# Patient Record
Sex: Male | Born: 1935 | Race: White | Hispanic: No | Marital: Married | Smoking: Never smoker
Health system: Southern US, Community
[De-identification: ages and names within clinical notes are randomized; demographics above are authoritative.]

## PROBLEM LIST (undated history)

## (undated) DIAGNOSIS — F419 Anxiety disorder, unspecified: Secondary | ICD-10-CM

## (undated) DIAGNOSIS — C801 Malignant (primary) neoplasm, unspecified: Secondary | ICD-10-CM

## (undated) DIAGNOSIS — Z8739 Personal history of other diseases of the musculoskeletal system and connective tissue: Secondary | ICD-10-CM

## (undated) DIAGNOSIS — J302 Other seasonal allergic rhinitis: Secondary | ICD-10-CM

## (undated) DIAGNOSIS — N400 Enlarged prostate without lower urinary tract symptoms: Secondary | ICD-10-CM

## (undated) DIAGNOSIS — N4289 Other specified disorders of prostate: Secondary | ICD-10-CM

## (undated) DIAGNOSIS — R531 Weakness: Secondary | ICD-10-CM

## (undated) DIAGNOSIS — R972 Elevated prostate specific antigen [PSA]: Secondary | ICD-10-CM

## (undated) DIAGNOSIS — M1990 Unspecified osteoarthritis, unspecified site: Secondary | ICD-10-CM

## (undated) DIAGNOSIS — H9190 Unspecified hearing loss, unspecified ear: Secondary | ICD-10-CM

## (undated) DIAGNOSIS — R310 Gross hematuria: Secondary | ICD-10-CM

## (undated) HISTORY — PX: CATARACT EXTRACTION W/ INTRAOCULAR LENS  IMPLANT, BILATERAL: SHX1307

## (undated) HISTORY — PX: PLANTAR'S WART EXCISION: SHX2240

## (undated) HISTORY — PX: TONSILLECTOMY: SUR1361

---

## 1998-04-28 ENCOUNTER — Ambulatory Visit (HOSPITAL_COMMUNITY): Admission: RE | Admit: 1998-04-28 | Discharge: 1998-04-28 | Payer: Self-pay | Admitting: Gastroenterology

## 2002-12-29 ENCOUNTER — Encounter (INDEPENDENT_AMBULATORY_CARE_PROVIDER_SITE_OTHER): Payer: Self-pay | Admitting: Specialist

## 2002-12-29 ENCOUNTER — Encounter: Payer: Self-pay | Admitting: Gastroenterology

## 2002-12-29 ENCOUNTER — Ambulatory Visit (HOSPITAL_COMMUNITY): Admission: RE | Admit: 2002-12-29 | Discharge: 2002-12-29 | Payer: Self-pay | Admitting: Gastroenterology

## 2006-03-15 ENCOUNTER — Encounter: Admission: RE | Admit: 2006-03-15 | Discharge: 2006-03-15 | Payer: Self-pay | Admitting: Internal Medicine

## 2007-01-28 ENCOUNTER — Ambulatory Visit (HOSPITAL_COMMUNITY): Admission: RE | Admit: 2007-01-28 | Discharge: 2007-01-28 | Payer: Self-pay

## 2007-01-29 ENCOUNTER — Encounter: Admission: RE | Admit: 2007-01-29 | Discharge: 2007-01-29 | Payer: Self-pay | Admitting: Internal Medicine

## 2007-09-18 ENCOUNTER — Encounter: Admission: RE | Admit: 2007-09-18 | Discharge: 2007-10-09 | Payer: Self-pay | Admitting: Gastroenterology

## 2008-06-08 ENCOUNTER — Ambulatory Visit: Payer: Self-pay | Admitting: Psychiatry

## 2008-08-08 ENCOUNTER — Encounter: Payer: Self-pay | Admitting: Emergency Medicine

## 2008-08-09 ENCOUNTER — Inpatient Hospital Stay (HOSPITAL_COMMUNITY): Admission: EM | Admit: 2008-08-09 | Discharge: 2008-08-12 | Payer: Self-pay | Admitting: Psychiatry

## 2008-08-16 ENCOUNTER — Other Ambulatory Visit (HOSPITAL_COMMUNITY): Admission: RE | Admit: 2008-08-16 | Discharge: 2008-09-27 | Payer: Self-pay | Admitting: Psychiatry

## 2008-08-19 ENCOUNTER — Ambulatory Visit: Payer: Self-pay | Admitting: Psychiatry

## 2008-08-24 ENCOUNTER — Emergency Department (HOSPITAL_COMMUNITY): Admission: EM | Admit: 2008-08-24 | Discharge: 2008-08-24 | Payer: Self-pay | Admitting: Emergency Medicine

## 2008-10-15 ENCOUNTER — Inpatient Hospital Stay (HOSPITAL_COMMUNITY): Admission: EM | Admit: 2008-10-15 | Discharge: 2008-10-18 | Payer: Self-pay | Admitting: Emergency Medicine

## 2008-10-15 ENCOUNTER — Ambulatory Visit: Payer: Self-pay | Admitting: Internal Medicine

## 2009-01-14 ENCOUNTER — Ambulatory Visit: Payer: Self-pay | Admitting: Surgery

## 2010-01-23 ENCOUNTER — Encounter: Admission: RE | Admit: 2010-01-23 | Discharge: 2010-01-23 | Payer: Self-pay | Admitting: Orthopedic Surgery

## 2010-01-25 ENCOUNTER — Ambulatory Visit (HOSPITAL_BASED_OUTPATIENT_CLINIC_OR_DEPARTMENT_OTHER): Admission: RE | Admit: 2010-01-25 | Discharge: 2010-01-25 | Payer: Self-pay | Admitting: Orthopedic Surgery

## 2010-01-25 HISTORY — PX: REPAIR SUBLUXING TENDON WITH GASTROC SLIDE: SHX5672

## 2010-11-20 LAB — COMPREHENSIVE METABOLIC PANEL
CO2: 26 mEq/L (ref 19–32)
Calcium: 9.4 mg/dL (ref 8.4–10.5)
Creatinine, Ser: 1.04 mg/dL (ref 0.4–1.5)
GFR calc non Af Amer: >60 mL/min (ref >60)
Glucose, Bld: 108 mg/dL — ABNORMAL HIGH (ref 70–99)

## 2010-11-20 LAB — POCT HEMOGLOBIN-HEMACUE: Hemoglobin: 14.3 g/dL (ref 13.0–17.0)

## 2010-12-18 LAB — BENZODIAZEPINE, QUANTITATIVE, URINE
Flurazepam GC/MS Conf: NEGATIVE
Nordiazepam GC/MS Conf: 250 ng/mL

## 2010-12-18 LAB — URINE DRUGS OF ABUSE SCREEN W ALC, ROUTINE (REF LAB)
Cocaine Metabolites: NEGATIVE
Methadone: NEGATIVE
Opiate Screen, Urine: NEGATIVE
Propoxyphene: NEGATIVE

## 2010-12-19 LAB — CBC
HCT: 30.2 % — ABNORMAL LOW (ref 39.0–52.0)
Hemoglobin: 10.5 g/dL — ABNORMAL LOW (ref 13.0–17.0)
MCHC: 34.8 g/dL (ref 30.0–36.0)
MCV: 92.4 fL (ref 78.0–100.0)
MCV: 92.8 fL (ref 78.0–100.0)
RBC: 3.25 MIL/uL — ABNORMAL LOW (ref 4.22–5.81)
RBC: 4 MIL/uL — ABNORMAL LOW (ref 4.22–5.81)
WBC: 8.7 10*3/uL (ref 4.0–10.5)

## 2010-12-19 LAB — CULTURE, BLOOD (ROUTINE X 2)
Culture: NO GROWTH
Culture: NO GROWTH

## 2010-12-19 LAB — POCT I-STAT, CHEM 8
Calcium, Ion: 1.22 mmol/L (ref 1.12–1.32)
Creatinine, Ser: 1.6 mg/dL — ABNORMAL HIGH (ref 0.4–1.5)
Glucose, Bld: 89 mg/dL (ref 70–99)
Hemoglobin: 12.9 g/dL — ABNORMAL LOW (ref 13.0–17.0)
Sodium: 141 mEq/L (ref 135–145)
TCO2: 24 mmol/L (ref 0–100)

## 2010-12-19 LAB — BASIC METABOLIC PANEL
BUN: 16 mg/dL (ref 6–23)
Creatinine, Ser: 0.98 mg/dL (ref 0.4–1.5)
GFR calc non Af Amer: >60 mL/min (ref >60)
Glucose, Bld: 79 mg/dL (ref 70–99)

## 2010-12-19 LAB — DIFFERENTIAL
Basophils Relative: 0 % (ref 0–1)
Eosinophils Absolute: 0.1 10*3/uL (ref 0.0–0.7)
Eosinophils Relative: 1 % (ref 0–5)
Monocytes Relative: 13 % — ABNORMAL HIGH (ref 3–12)
Neutrophils Relative %: 61 % (ref 43–77)

## 2010-12-19 LAB — CULTURE, ROUTINE-ABSCESS

## 2011-01-16 NOTE — Discharge Summary (Signed)
NAME:  Michael Estes, Michael Estes             ACCOUNT NO.:  [id number]   MEDICAL RECORD NO.:  [id number]          PATIENT TYPE:  INP   LOCATION:  5015                         FACILITY:  [facility]   PHYSICIAN:  Kela Millin, M.D.DATE OF BIRTH:  07-08-1932   DATE OF ADMISSION:  10/15/2008  DATE OF DISCHARGE:  10/18/2008                               DISCHARGE SUMMARY   DISCHARGE DIAGNOSES:  1. Right foot cellulitis with hemorrhagic bulla - On plantar surface      of right great toe, status post incision and drainage.  2. History of gout.  3. History of depression.  4. Prior history of alcohol abuse.   PROCEDURES AND STUDIES:  Incision and drainage of hemorrhagic bulla in  the ED - Cultures growing diphtheroids.   BRIEF HISTORY:  The patient is a pleasant 77 year old white male with  the above-listed medical problems who presented with right foot pain and  swelling.  He reported that he had worn some new shoes to a funeral and  the following day developed pain and swelling on the plantar aspect of  his right foot.  It progressed in the few days prior to admission to  where he was having chills and worsening pain and swelling in that right  foot.  He was seen in the ED and an x-ray of that foot was done which  showed a soft tissue lucency/swelling underlying the proximal phalanx of  the great toe compatible with the patient's known wound/blister and no  evidence of acute abnormality.  He was admitted for further evaluation  and management.   Please see the full admission history and physical dictated on October 15, 2008, by Dr. Amador Cunas for the details of the admission physical  exam, as well as the laboratory data.   HOSPITAL COURSE:  1. Right foot cellulitis with hemorrhagic bulla - Upon admission, the      hemorrhagic bulla was incised and drained in the ED and he was      empirically started on IV antibiotics.  The drainage was sent for      cultures and as stated above grew  diphtheroids.  He had an episode      of hypotension following admission and this resolved with IV      fluids.  Blood cultures were also sent for cultures and no growth      to date on the blood cultures.  He has remained afebrile and      hemodynamically stable and the mild leukocytosis that he had on      admission of 10.6 has resolved - His last white cell count prior to      discharge 8.7.  Wound care nurse was consulted and followed patient      during his hospital stay for local wound care and further debrided      the area on his great toe.  Home health R.N. is to follow patient      and assist with dressing changes.  He will be discharged on oral      antibiotics at this time and is to follow up  with his primary care      physician upon discharge.  2. History of gout - He was maintained on his colchicine during his      hospital stay.  3. History of depression - He was maintained on his Celexa during his      hospital stay.   DISCHARGE MEDICATIONS:  1. Ceftin 500 mg p.o. b.i.d.  2. Vicodin 1 p.o. every 6 hours p.r.n.  3. Patient to continue Celexa 20 mg p.o. daily.  4. Colchicine 0.6 mg p.o. daily.   FOLLOWUP CARE:  Dr. Kirby Funk in the next 1 to 2 weeks.   DISCHARGE CONDITION:  Improved/stable.      Kela Millin, M.D.  Electronically Signed     ACV/MEDQ  D:  10/18/2008  T:  10/18/2008  Job:  [id number]   cc:   Thora Lance, M.D.

## 2011-01-16 NOTE — Discharge Summary (Signed)
NAME:  Michael Estes, Michael Estes NO.:  [id number]   MEDICAL RECORD NO.:  [id number]          PATIENT TYPE:  IPS   LOCATION:  0305                          FACILITY[facility]  [facility]   PHYSICIAN:  Geoffery Lyons, M.D.      DATE OF BIRTH:  1932/05/14   DATE OF ADMISSION:  08/09/2008  DATE OF DISCHARGE:  08/12/2008                               DISCHARGE SUMMARY   CHIEF COMPLAINT AND PRESENT ILLNESS:  This was the first admission to  [facility] for this 77 year old male.  History of  alcohol use, drinking a lot.  He fell recently, sustained laceration,  left forehead.  Denying any suicidal or homicidal ideas, was wanting to  be detoxed.   PAST PSYCHIATRIC HISTORY:  First time at [facility].  No current  outpatient treatment.   ALCOHOL AND DRUG HISTORY:  Persistent use of alcohol.   MEDICAL HISTORY:  Status post laceration and abrasion, right knee.   MEDICATIONS:  1. Celexa 20 mg per day.  2. Colchicine 0.6 twice a day.  3. Diazepam 10 mg per day.   PHYSICAL EXAM:  Compatible with the above-described lesions.   LABORATORY WORKUP:  Sodium 135, potassium 3.9, glucose 96, TSH 3.307,  B12 of 471, folate 683.  RPR nonreactive.  EKG, normal EKG.   MENTAL STATUS EXAM:  Reveals a fully alert, cooperative male.  Good eye  contact.  Speech clear, very soft-spoken.  Mood anxious.  Affect broad.  Thought processes logical, coherent, and relevant.  Upset with himself  for having allowed the situation that is going on and underlying sadness  and depression, wanting to get his life back together.  No active  suicidal or homicidal ideas.  No hallucinations or delusions.  Cognition  well preserved.   ADMITTING DIAGNOSES:  AXIS I:  1. Alcohol dependence.  2. Major depressive disorder.  AXIS II:  No diagnosis.  AXIS III:  Status post fall with lacerations on the left forehead and  knee.  AXIS IV:  Moderate.  AXIS V:  Upon admission, 35.  Highest Global Assessment of  Functioning  in the last year 70.   COURSE IN THE HOSPITAL:  He was admitted.  We detoxed with Librium.  As  already stated, did admit that he fell down, was watching a football  game.  Admits he was drinking, drank too much.  He is retired, got into  drink a lot since retirement, mostly bourbon.  Does endorse decreased  energy, decreased motivation, decreased appetite.  In the past, had gone  to AA.  We went ahead and increased the Celexa to 40 mg per day.  The  detox went uneventfully.  The wife was active in treatment.  He was  willing to come to the [facility].  Did understand that he needed to  quit as alcohol is a depressant and is already causing problems for him.  He was willing to do whatever he needed to do to be able to stay  abstinent.  On August 12, 2008, he was in full contact with reality.  No active withdrawal.  No  active suicidal or homicidal ideas.  No  hallucinations or delusions.  Fully detoxed from alcohol.  The  antidepressants were adjusted.  He was willing to pursue outpatient  treatment through our [facility] program.   DISCHARGE DIAGNOSES:  AXIS I:  1. Alcohol dependence.  2. Major depressive disorder.  AXIS II:  No diagnosis.  AXIS III:  1. Laceration to face and knee status post fall.  2. Gout.  AXIS IV:  Moderate.  AXIS V:  Upon discharge 50 to 55.   Discharged on:  1. Celexa 40 mg per day.  2. Colchicine 0.6 twice a day.   Discharged to [facility], [facility].      Geoffery Lyons, M.D.  Electronically Signed     IL/MEDQ  D:  09/06/2008  T:  09/07/2008  Job:  [id number]

## 2011-01-16 NOTE — H&P (Signed)
NAME:  Michael Estes, KRING NO.:  [id number]   MEDICAL RECORD NO.:  [id number]          PATIENT TYPE:  INP   LOCATION:  5015                         FACILITY:  [facility]   PHYSICIAN:  Gordy Savers, MDDATE OF BIRTH:  [birth year]   DATE OF ADMISSION:  10/15/2008  DATE OF DISCHARGE:                              HISTORY & PHYSICAL   CHIEF COMPLAINT:  Pain and swelling, right foot.   HISTORY OF PRESENT ILLNESS:  The patient is a 77 year old gentleman who  was stable until approximately 6 days ago.  At that time, he wore some  new dress, shoes at a funeral and the following day developed some pain  and swelling involving the plantar aspect of his right foot.  This has  progressed over the past few days and over the past 2 days, he has noted  some occasional chills.  Today, he had worsening pain and soft tissue  swelling involving his right foot and was seen in the ED for evaluation.  ED evaluation included x-rays of the right foot.  This revealed soft  tissue swelling underlying the proximal phalanx of the great toe, but  there were no bony abnormalities, evidence of osteomyelitis, and no  subcutaneous air.  White count was essentially normal at 10.6.  The  patient was now admitted for further evaluation and treatment of his  right foot cellulitis.   PAST MEDICAL HISTORY:  The patient was hospitalized in December of last  year to the behavioral health center for alcoholic detoxification.  He  has a history of gout and a history of depression.  He has no known  allergies.  Medical regimen includes citalopram 20 mg daily, colchicine  0.6 mg daily.   SOCIAL HISTORY:  Married, nonsmoker, remains abstinent since his  hospital discharge.   FAMILY HISTORY:  Father died at age 43, complications of lung cancer and  COPD.  Mother died at age 20.  One brother deceased from CNS neoplasm.  Two sisters, one deceased of lung cancer.   REVIEW OF SYSTEMS:  Exam is, otherwise,  noncontributory except as  mentioned in history of present illness.  He denies any prior skin or  soft tissue infections.  There has been no exposure to MRSA and no  recent antibiotic use.   PHYSICAL EXAMINATION:  GENERAL:  Well-developed, well-nourished male in  no acute distress.  SKIN:  Warm and dry without rash.  NECK:  Normal pupil responses.  Conjunctiva clear.  ENT:  Normal.  The patient was edentulous.  NECK:  No bruits or adenopathy.  No neck vein distention.  CHEST:  Clear.  CARDIOVASCULAR:  Normal heart sounds.  No murmurs.  ABDOMEN:  Soft, nontender.  No organomegaly.  EXTERNAL GENITALIA:  Normal.  EXTREMITIES:  Full pedal pulses bilaterally.  The left foot was  unremarkable.  Examination of the right foot revealed a large  hemorrhagic blister involving the plantar aspect of the right first MTP  joint.  There was considerable soft tissue swelling and tenderness and  excessive warmth involving the toe.  This spread to the dorsal surface  of the toe and  distal foot.   IMPRESSION:  Cellulitis, right foot.   ADDITIONAL DIAGNOSES:  History of depression, history of gout, history  of alcoholism.   DISPOSITION:  The patient will be admitted to the hospital and placed on  parenteral antibiotic therapy.  The hemorrhagic bulla will be I and D'd  in the ED and sent for Gram stain and routine culture.  The followup CBC  will be checked in the morning.  Routine wound care with daily cleansing  and dressing will be instituted, as well as DVT prophylaxis.  The  patient will be treated with analgesics for pain control.      Gordy Savers, MD  Electronically Signed     PFK/MEDQ  D:  10/15/2008  T:  10/16/2008  Job:  [id number]

## 2011-06-07 LAB — ETHANOL: Alcohol, Ethyl (B): 299 mg/dL — ABNORMAL HIGH (ref 0–10)

## 2011-06-07 LAB — DIFFERENTIAL
Basophils Absolute: 0 10*3/uL (ref 0.0–0.1)
Eosinophils Absolute: 0.2 10*3/uL (ref 0.0–0.7)
Eosinophils Relative: 3 % (ref 0–5)
Lymphocytes Relative: 56 % — ABNORMAL HIGH (ref 12–46)
Lymphs Abs: 3.8 10*3/uL (ref 0.7–4.0)
Neutrophils Relative %: 29 % — ABNORMAL LOW (ref 43–77)

## 2011-06-07 LAB — CBC
HCT: 39.4 % (ref 39.0–52.0)
MCV: 97.6 fL (ref 78.0–100.0)
Platelets: 166 10*3/uL (ref 150–400)
RDW: 13.7 % (ref 11.5–15.5)
WBC: 6.8 10*3/uL (ref 4.0–10.5)

## 2011-06-07 LAB — BASIC METABOLIC PANEL
BUN: 8 mg/dL (ref 6–23)
Creatinine, Ser: 0.95 mg/dL (ref 0.4–1.5)
GFR calc non Af Amer: >60 mL/min (ref >60)
Glucose, Bld: 89 mg/dL (ref 70–99)

## 2011-06-07 LAB — RAPID URINE DRUG SCREEN, HOSP PERFORMED
Amphetamines: NOT DETECTED
Barbiturates: NOT DETECTED
Opiates: NOT DETECTED

## 2011-06-08 LAB — URINE DRUGS OF ABUSE SCREEN W ALC, ROUTINE (REF LAB)
Amphetamine Screen, Ur: NEGATIVE
Amphetamine Screen, Ur: NEGATIVE
Barbiturate Quant, Ur: NEGATIVE
Barbiturate Quant, Ur: NEGATIVE
Benzodiazepines.: POSITIVE — AB
Cocaine Metabolites: NEGATIVE
Creatinine,U: 182 mg/dL
Creatinine,U: 209 mg/dL
Ethyl Alcohol: <10 mg/dL (ref <10)
Marijuana Metabolite: NEGATIVE
Marijuana Metabolite: NEGATIVE
Methadone: NEGATIVE
Phencyclidine (PCP): NEGATIVE
Propoxyphene: NEGATIVE

## 2011-06-08 LAB — BENZODIAZEPINE, QUANTITATIVE, URINE
Alprazolam (GC/LC/MS), ur confirm: NEGATIVE
Alprazolam (GC/LC/MS), ur confirm: NEGATIVE
Flurazepam GC/MS Conf: NEGATIVE
Flurazepam GC/MS Conf: NEGATIVE
Nordiazepam GC/MS Conf: 710 ng/mL
Oxazepam GC/MS Conf: 1900 ng/mL
Oxazepam GC/MS Conf: 2500 ng/mL

## 2011-06-08 LAB — BASIC METABOLIC PANEL
CO2: 23 mEq/L (ref 19–32)
Chloride: 104 mEq/L (ref 96–112)
GFR calc Af Amer: >60 mL/min (ref >60)
Potassium: 3.9 mEq/L (ref 3.5–5.1)
Sodium: 135 mEq/L (ref 135–145)

## 2011-06-08 LAB — T4, FREE: Free T4: 0.97 ng/dL (ref 0.89–1.80)

## 2011-06-08 LAB — VITAMIN B12: Vitamin B-12: 471 pg/mL (ref 211–911)

## 2011-06-08 LAB — TSH: TSH: 3.307 u[IU]/mL (ref 0.350–4.500)

## 2011-06-08 LAB — FOLATE RBC: RBC Folate: 683 ng/mL — ABNORMAL HIGH (ref 180–600)

## 2011-06-08 LAB — RPR: RPR Ser Ql: NONREACTIVE

## 2013-09-15 ENCOUNTER — Emergency Department (HOSPITAL_COMMUNITY): Payer: Medicare Other

## 2013-09-15 ENCOUNTER — Encounter (HOSPITAL_COMMUNITY): Payer: Self-pay | Admitting: Emergency Medicine

## 2013-09-15 ENCOUNTER — Inpatient Hospital Stay (HOSPITAL_COMMUNITY): Payer: Medicare Other

## 2013-09-15 ENCOUNTER — Inpatient Hospital Stay (HOSPITAL_COMMUNITY)
Admission: EM | Admit: 2013-09-15 | Discharge: 2013-09-18 | DRG: 660 | Disposition: A | Discharge status: Home Health | Payer: Medicare Other | Attending: Internal Medicine | Admitting: Internal Medicine

## 2013-09-15 DIAGNOSIS — F411 Generalized anxiety disorder: Secondary | ICD-10-CM | POA: Diagnosis present

## 2013-09-15 DIAGNOSIS — Z808 Family history of malignant neoplasm of other organs or systems: Secondary | ICD-10-CM

## 2013-09-15 DIAGNOSIS — D62 Acute posthemorrhagic anemia: Secondary | ICD-10-CM | POA: Diagnosis not present

## 2013-09-15 DIAGNOSIS — R112 Nausea with vomiting, unspecified: Secondary | ICD-10-CM | POA: Diagnosis present

## 2013-09-15 DIAGNOSIS — C7919 Secondary malignant neoplasm of other urinary organs: Secondary | ICD-10-CM | POA: Diagnosis present

## 2013-09-15 DIAGNOSIS — N429 Disorder of prostate, unspecified: Secondary | ICD-10-CM

## 2013-09-15 DIAGNOSIS — N179 Acute kidney failure, unspecified: Secondary | ICD-10-CM | POA: Diagnosis present

## 2013-09-15 DIAGNOSIS — C7951 Secondary malignant neoplasm of bone: Secondary | ICD-10-CM | POA: Diagnosis present

## 2013-09-15 DIAGNOSIS — Z8042 Family history of malignant neoplasm of prostate: Secondary | ICD-10-CM

## 2013-09-15 DIAGNOSIS — D72829 Elevated white blood cell count, unspecified: Secondary | ICD-10-CM | POA: Diagnosis present

## 2013-09-15 DIAGNOSIS — R599 Enlarged lymph nodes, unspecified: Secondary | ICD-10-CM | POA: Diagnosis present

## 2013-09-15 DIAGNOSIS — M109 Gout, unspecified: Secondary | ICD-10-CM | POA: Diagnosis present

## 2013-09-15 DIAGNOSIS — N4289 Other specified disorders of prostate: Secondary | ICD-10-CM | POA: Diagnosis present

## 2013-09-15 DIAGNOSIS — R1110 Vomiting, unspecified: Secondary | ICD-10-CM

## 2013-09-15 DIAGNOSIS — N1330 Unspecified hydronephrosis: Secondary | ICD-10-CM | POA: Diagnosis present

## 2013-09-15 DIAGNOSIS — C7952 Secondary malignant neoplasm of bone marrow: Secondary | ICD-10-CM

## 2013-09-15 DIAGNOSIS — C61 Malignant neoplasm of prostate: Secondary | ICD-10-CM | POA: Diagnosis present

## 2013-09-15 DIAGNOSIS — N135 Crossing vessel and stricture of ureter without hydronephrosis: Secondary | ICD-10-CM | POA: Diagnosis present

## 2013-09-15 DIAGNOSIS — R109 Unspecified abdominal pain: Secondary | ICD-10-CM | POA: Diagnosis present

## 2013-09-15 HISTORY — DX: Benign prostatic hyperplasia without lower urinary tract symptoms: N40.0

## 2013-09-15 HISTORY — DX: Unspecified hearing loss, unspecified ear: H91.90

## 2013-09-15 HISTORY — DX: Other seasonal allergic rhinitis: J30.2

## 2013-09-15 HISTORY — DX: Unspecified osteoarthritis, unspecified site: M19.90

## 2013-09-15 HISTORY — DX: Anxiety disorder, unspecified: F41.9

## 2013-09-15 LAB — BASIC METABOLIC PANEL
BUN: 87 mg/dL — AB (ref 6–23)
CO2: 18 mEq/L — ABNORMAL LOW (ref 19–32)
Calcium: 8.9 mg/dL (ref 8.4–10.5)
Chloride: 100 mEq/L (ref 96–112)
Creatinine, Ser: 4.28 mg/dL — ABNORMAL HIGH (ref 0.50–1.35)
GFR, EST AFRICAN AMERICAN: 14 mL/min — AB (ref >90)
GFR, EST NON AFRICAN AMERICAN: 12 mL/min — AB (ref >90)
Glucose, Bld: 109 mg/dL — ABNORMAL HIGH (ref 70–99)
POTASSIUM: 3.9 meq/L (ref 3.7–5.3)
Sodium: 138 mEq/L (ref 137–147)

## 2013-09-15 LAB — URINALYSIS, ROUTINE W REFLEX MICROSCOPIC
BILIRUBIN URINE: NEGATIVE
Glucose, UA: NEGATIVE mg/dL
KETONES UR: NEGATIVE mg/dL
Leukocytes, UA: NEGATIVE
NITRITE: NEGATIVE
Protein, ur: NEGATIVE mg/dL
SPECIFIC GRAVITY, URINE: 1.015 (ref 1.005–1.030)
UROBILINOGEN UA: 0.2 mg/dL (ref 0.0–1.0)
pH: 5 (ref 5.0–8.0)

## 2013-09-15 LAB — HEPATIC FUNCTION PANEL
ALT: 18 U/L (ref 0–53)
AST: 38 U/L — AB (ref 0–37)
Albumin: 2.8 g/dL — ABNORMAL LOW (ref 3.5–5.2)
Alkaline Phosphatase: 156 U/L — ABNORMAL HIGH (ref 39–117)
Bilirubin, Direct: 0.4 mg/dL — ABNORMAL HIGH (ref 0.0–0.3)
Indirect Bilirubin: 0.8 mg/dL (ref 0.3–0.9)
Total Bilirubin: 1.2 mg/dL (ref 0.3–1.2)
Total Protein: 6 g/dL (ref 6.0–8.3)

## 2013-09-15 LAB — CBC
HEMATOCRIT: 31.9 % — AB (ref 39.0–52.0)
Hemoglobin: 11.2 g/dL — ABNORMAL LOW (ref 13.0–17.0)
MCH: 32.5 pg (ref 26.0–34.0)
MCHC: 35.1 g/dL (ref 30.0–36.0)
MCV: 92.5 fL (ref 78.0–100.0)
Platelets: 87 10*3/uL — ABNORMAL LOW (ref 150–400)
RBC: 3.45 MIL/uL — ABNORMAL LOW (ref 4.22–5.81)
RDW: 14.6 % (ref 11.5–15.5)
WBC: 15.7 10*3/uL — ABNORMAL HIGH (ref 4.0–10.5)

## 2013-09-15 LAB — URINE MICROSCOPIC-ADD ON

## 2013-09-15 LAB — CG4 I-STAT (LACTIC ACID): Lactic Acid, Venous: 1.33 mmol/L (ref 0.5–2.2)

## 2013-09-15 MED ORDER — SODIUM CHLORIDE 0.9 % IV SOLN
INTRAVENOUS | Status: DC
  Administered 2013-09-15 – 2013-09-18 (×4): via INTRAVENOUS

## 2013-09-15 MED ORDER — SODIUM CHLORIDE 0.9 % IV BOLUS (SEPSIS)
1000.0000 mL | Freq: Once | INTRAVENOUS | Status: AC
  Administered 2013-09-15: 1000 mL via INTRAVENOUS

## 2013-09-15 MED ORDER — PANTOPRAZOLE SODIUM 40 MG IV SOLR
40.0000 mg | Freq: Every day | INTRAVENOUS | Status: DC
  Administered 2013-09-15 – 2013-09-17 (×3): 40 mg via INTRAVENOUS
  Filled 2013-09-15 (×3): qty 40

## 2013-09-15 MED ORDER — ONDANSETRON HCL 4 MG PO TABS: 4.0000 mg | ORAL_TABLET | Freq: Four times a day (QID) | ORAL | Status: DC | PRN

## 2013-09-15 MED ORDER — CITALOPRAM HYDROBROMIDE 20 MG PO TABS
20.0000 mg | ORAL_TABLET | Freq: Every day | ORAL | Status: DC
  Administered 2013-09-16 – 2013-09-18 (×3): 20 mg via ORAL
  Filled 2013-09-15 (×3): qty 1

## 2013-09-15 MED ORDER — ACETAMINOPHEN 325 MG PO TABS: 650.0000 mg | ORAL_TABLET | Freq: Four times a day (QID) | ORAL | Status: DC | PRN

## 2013-09-15 MED ORDER — ONDANSETRON HCL 4 MG/2ML IJ SOLN: 4.0000 mg | Freq: Four times a day (QID) | INTRAMUSCULAR | Status: DC | PRN

## 2013-09-15 MED ORDER — ALLOPURINOL 100 MG PO TABS
100.0000 mg | ORAL_TABLET | Freq: Every day | ORAL | Status: DC
  Administered 2013-09-16 – 2013-09-18 (×3): 100 mg via ORAL
  Filled 2013-09-15 (×3): qty 1

## 2013-09-15 MED ORDER — ACETAMINOPHEN 650 MG RE SUPP: 650.0000 mg | Freq: Four times a day (QID) | RECTAL | Status: DC | PRN

## 2013-09-15 MED ORDER — LORATADINE 10 MG PO TABS
10.0000 mg | ORAL_TABLET | Freq: Every day | ORAL | Status: DC
Start: 2013-09-16 — End: 2013-09-18
  Administered 2013-09-16 – 2013-09-18 (×3): 10 mg via ORAL
  Filled 2013-09-15 (×3): qty 1

## 2013-09-15 MED ORDER — MORPHINE SULFATE 2 MG/ML IJ SOLN
0.5000 mg | INTRAMUSCULAR | Status: DC | PRN
  Administered 2013-09-16: 1 mg via INTRAVENOUS
  Filled 2013-09-15: qty 1

## 2013-09-15 MED ORDER — AZITHROMYCIN 250 MG PO TABS
250.0000 mg | ORAL_TABLET | Freq: Every day | ORAL | Status: DC
  Administered 2013-09-16: 250 mg via ORAL
  Filled 2013-09-15 (×2): qty 1

## 2013-09-15 MED ORDER — ENOXAPARIN SODIUM 30 MG/0.3ML ~~LOC~~ SOLN
30.0000 mg | SUBCUTANEOUS | Status: DC
  Administered 2013-09-15 – 2013-09-16 (×2): 30 mg via SUBCUTANEOUS
  Filled 2013-09-15 (×3): qty 0.3

## 2013-09-15 NOTE — Progress Notes (Signed)
Foley has been d/c'ed-patient refused

## 2013-09-15 NOTE — Progress Notes (Signed)
Michael Estes [id number] Admission Data: 09/15/2013 7:53 PM Attending Provider: Sheila Oats, MD  PCP:No primary provider on file. Consults/ Treatment Team:    Michael Estes is a 80 y.o. male patient admitted from ED awake, alert  & orientated  X 3,  Full Code, VSS - Blood pressure 148/74, pulse 79, temperature 98 F (36.7 C), temperature source Oral, resp. rate 18, SpO2 99.00%.   IV site WDL: R ac placed the 09/15/13  with a transparent dsg that's clean dry and intact.  Allergies:  No Known Allergies   Past Medical History  Diagnosis Date  . Gout   . Seasonal allergies   . Anxiety     Pt orientation to unit, room and routine.  Admission INP armband ID verified with patient/family, and in place. SR up x 2, fall risk assessment complete with Patient and family verbalizing understanding of risks associated with falls. Pt verbalizes an understanding of how to use the call bell and to call for help before getting out of bed.  Skin, clean-dry- intact without evidence of bruising, or skin tears.   No evidence of skin break down noted on exam.     Will cont to monitor and assist as needed.  Dayle Points, RN 09/15/2013 7:53 PM

## 2013-09-15 NOTE — ED Notes (Signed)
Lactic acid results given to [employer]

## 2013-09-15 NOTE — ED Provider Notes (Signed)
CSN: [id number]     Arrival date & time 09/15/13  0848 History   First MD Initiated Contact with Patient 09/15/13 [phone]     Chief Complaint  Patient presents with  . Generalized Body Aches  . Emesis  . Flank Pain   (Consider location/radiation/quality/duration/timing/severity/associated sxs/prior Treatment) HPI Comments: Sent in by PCP for lab work shows concerns for renal failure. Recently sick with flu like symptoms for past 4 days.  Patient is a 80 y.o. male presenting with vomiting and flank pain. The history is provided by the patient.  Emesis Severity:  Moderate Duration:  4 days Timing:  Constant Quality:  Stomach contents Progression:  Unchanged Chronicity:  New Recent urination:  Normal Relieved by:  Nothing Worsened by:  Nothing tried Associated symptoms: chills   Associated symptoms: no abdominal pain, no cough, no fever, no myalgias, no sore throat and no URI   Flank Pain Pertinent negatives include no abdominal pain.    History reviewed. No pertinent past medical history. History reviewed. No pertinent past surgical history. History reviewed. No pertinent family history. History  Substance Use Topics  . Smoking status: Never Smoker   . Smokeless tobacco: Never Used  . Alcohol Use: Yes    Review of Systems  Constitutional: Positive for chills.  HENT: Negative for sore throat.   Gastrointestinal: Positive for vomiting. Negative for abdominal pain.  Genitourinary: Positive for flank pain.  Musculoskeletal: Negative for myalgias.  All other systems reviewed and are negative.    Allergies  Review of patient's allergies indicates no known allergies.  Home Medications  No current outpatient prescriptions on file. BP 104/66  Pulse 101  Temp(Src) 97.8 F (36.6 C) (Oral)  Resp 18  SpO2 100% Physical Exam  Nursing note and vitals reviewed. Constitutional: He is oriented to person, place, and time. He appears well-developed and well-nourished. No  distress.  HENT:  Head: Normocephalic and atraumatic.  Mouth/Throat: No oropharyngeal exudate.  Eyes: EOM are normal. Pupils are equal, round, and reactive to light.  Neck: Normal range of motion. Neck supple.  Cardiovascular: Normal rate and regular rhythm.  Exam reveals no friction rub.   No murmur heard. Pulmonary/Chest: Effort normal and breath sounds normal. No respiratory distress. He has no wheezes. He has no rales.  Abdominal: He exhibits no distension. There is tenderness (mild, left sided). There is no rebound.  Musculoskeletal: Normal range of motion. He exhibits no edema.  Neurological: He is alert and oriented to person, place, and time.  Skin: He is not diaphoretic.    ED Course  Procedures (including critical care time) Labs Review Labs Reviewed  CBC  BASIC METABOLIC PANEL  URINALYSIS, ROUTINE W REFLEX MICROSCOPIC   Imaging Review Ct Abdomen Pelvis Wo Contrast  09/15/2013   CLINICAL DATA:  General body aches and. Emesis. Left-sided flank pain.  EXAM: CT ABDOMEN AND PELVIS WITHOUT CONTRAST  TECHNIQUE: Multidetector CT imaging of the abdomen and pelvis was performed following the standard protocol without intravenous contrast.  COMPARISON:  01/28/2007 CT abdomen pelvis.  FINDINGS: Basilar scarring/ subsegmental atelectasis.  Markedly enlarged prostate gland with asymmetric extension to the right. This may be invading the right bladder wall at the level of the right ureterovesical junction contributing to prominent right-sided hydroureteronephrosis. This highly suspicious for prostate carcinoma. Pelvic adenopathy greatest right operator region measuring 3.7 x 2 cm. Right retrocrural slightly prominent size lymph node. Para-aortic and interaortic caval adenopathy. Multiple sclerotic foci involving the ileum bilaterally, sacrum, L3, L2 and T12 suspicious for  sclerotic metastatic disease.  Tiny nonobstructing renal calculi.  Taking into account limitation by non contrast imaging, no  worrisome of hepatic, splenic, pancreatic, renal or adrenal lesion. No calcified gallstones.  Scattered colonic diverticula without extra luminal bowel inflammatory process, free fluid or free air.  Ankylosis L2 through S1.  Degenerative changes above this region.  Atherosclerotic type changes aorta with marked ectasia without focal aneurysm. Atherosclerotic type changes with ectasia common iliac arteries. Atherosclerotic type changes aortic branch vessels with ectatic tortuous splenic artery.  IMPRESSION: Findings highly suspicious for prostate cancer with with invasion of the right bladder wall causing marked right-sided hydronephrosis. Metastatic disease suspected as indicated by adenopathy and the osseous sclerotic foci as noted above.  Atherosclerotic type changes aorta and common iliac arteries.  These results were called by telephone at the time of interpretation on 09/15/2013 at 11:48 AM to Dr. Murlean Caller , who verbally acknowledged these results.   Electronically Signed   By: Chauncey Cruel M.D.   On: 09/15/2013 11:46   Dg Chest 2 View  09/15/2013   CLINICAL DATA:  Elevated WBC, weakness, confusion  EXAM: CHEST  2 VIEW  COMPARISON:  01/23/2010  FINDINGS: Cardiomediastinal silhouette is stable. No acute infiltrate or pleural effusion. No pulmonary edema. Mild degenerative changes thoracic spine.  IMPRESSION: No active cardiopulmonary disease.   Electronically Signed   By: Lahoma Crocker M.D.   On: 09/15/2013 11:18   US Renal  09/15/2013   CLINICAL DATA:  Elevated creatinine  EXAM: RENAL/URINARY TRACT ULTRASOUND COMPLETE  COMPARISON:  CT from earlier in the same day.  FINDINGS: Right Kidney:  Length: 11.2 cm in length. Hydronephrosis with prominent ureter is noted similar to that seen on recent CT.  Left Kidney:  Length: 12.2 cm. Echogenicity within normal limits. No mass or hydronephrosis visualized.  Bladder:  Bladder is well distended. Significant enlargement of the prostate (6.3 x 4.1 x 5.1 cm) is  noted similar to that seen on recent CT examination. Bilateral ureteral jets are noted however.  IMPRESSION: Right-sided hydronephrosis and hydroureter consistent with that seen on recent CT examination.  Enlarged prostate similar to that seen on recent CT examination highly suggestive of prostate carcinoma with invasion into the bladder.   Electronically Signed   By: Inez Catalina M.D.   On: 09/15/2013 19:21    EKG Interpretation    Date/Time:  Tuesday September 15 2013 09:00:27 EST Ventricular Rate:  99 PR Interval:  155 QRS Duration: 110 QT Interval:  362 QTC Calculation: 464 R Axis:     Text Interpretation:  Sinus rhythm No significant change Confirmed by Mingo Amber  MD, Brumley ([id number]) on 09/15/2013 9:17:03 AM            MDM   1. Acute renal failure   2. Vomiting   3. Hydronephrosis   4. Leukocytosis, unspecified   5. Nausea and vomiting   6. Prostate mass    80 year old male presents with concerns for dehydration. He was sent in by his primary physician he checks labs yesterday and is concerned about renal failure. Patient has been having vomiting for the past 4 days. He was concerned that the fluid disease had some chills and mild upper respiratory symptoms. Here he has stable vitals. His belly has mild tenderness in the left side without rebound or guarding. He does not appear dehydrated with moist mucous membranes, normal skin turgor, brisk cap refill. We'll give fluids and check labs again. Labs show ARF. CT of abdomen, requested by Internal  Medicine, shows signs concerning for metastatic prostate cancer. I discussed this with the patient. Admitted to medicine.   I have reviewed all labs and imaging and considered them in my medical decision making.    Osvaldo Shipper, MD 09/16/13 [phone]

## 2013-09-15 NOTE — Progress Notes (Signed)
Patient was suppose to receive a foley this afternoon. Patient has denied this procedure and stated to the RN that he is urinating just fine. NP Baltazar Najjar has been paged. RN will continue to monitor

## 2013-09-15 NOTE — Progress Notes (Addendum)
[facility] History and Physical  Michael Estes KWI:[id number] DOB: 07-29-1932 DOA: 09/15/2013  Referring physician: EDP PCP: No primary provider on file.   Chief Complaint: Asst. nausea vomiting and abdominal pain  HPI: Michael Estes is a 80 y.o. male with history of gout and anxiety or presents with complaints of nausea or vomiting x4 days. He states that he had also been having some URI symptoms and he was evaluated at his PCPs office for the flu but was told that he did not have it and was been treated with a Zithromax. He reports that each time he attempts to eat, he vomits-non bloody. He also reports that he has had abdominal pain was on the eft side and radiates down to his right left groin. He denies dysuria, diarrhea, melena and hematochezia. In the ED urinalysis was not impressive for UTI, chest x-ray was negative for acute findings, WBC count was 15 lactic acid within normal limits. CT scan of his abdomen and pelvis was done and findings highly suspicious for prostate cancer with invasion of the right bladder wall causing marked right-sided hydronephrosis-metastatic disease suspected as indicated adenopathy and question of sclerotic foci at T12, L2, L 3 and the ileum bilaterally and sacrum. He is admitted for further evaluation and management    Review of Systems The patient denies , weight loss,, vision loss, decreased hearing, hoarseness, chest pain, syncope, dyspnea on exertion, peripheral edema, balance deficits, hemoptysis, melena, hematochezia, severe indigestion/heartburn, hematuria, incontinence.  Past Medical History  Diagnosis Date  . Gout   . Seasonal allergies   . Anxiety    History reviewed. No pertinent past surgical history. Social History:  reports that he has never smoked. He has never used smokeless tobacco. He reports that he drinks alcohol. He reports that he does not use illicit drugs.  No Known Allergies  Family history: His father has had  prostate cancer, sister breast cancer and diabetes, another sister had pancreatic cancer and one of his brothers had brain cancer  Prior to Admission medications   Medication Sig Start Date End Date Taking? Authorizing Provider  allopurinol (ZYLOPRIM) 300 MG tablet Take 300 mg by mouth daily.   Yes Historical Provider, MD  azithromycin (ZITHROMAX) 250 MG tablet Take 250-500 mg by mouth daily. Taking 500 mg for one day, then 250mg  for 4 days.  Started on 09-14-13.   Yes Historical Provider, MD  cetirizine (ZYRTEC) 10 MG tablet Take 10 mg by mouth daily.   Yes Historical Provider, MD  Cholecalciferol (VITAMIN D PO) Take 1 tablet by mouth daily.   Yes Historical Provider, MD  citalopram (CELEXA) 20 MG tablet Take 20 mg by mouth daily.   Yes Historical Provider, MD  promethazine (PHENERGAN) 25 MG tablet Take 25-50 mg by mouth every 6 (six) hours as needed for nausea or vomiting.   Yes Historical Provider, MD   Physical Exam: Filed Vitals:   09/15/13 1715  BP: 148/74  Pulse: 79  Temp: 98 F (36.7 C)  Resp: 18    BP 148/74  Pulse 79  Temp(Src) 98 F (36.7 C) (Oral)  Resp 18  SpO2 99% Constitutional: Vital signs reviewed.  Patient is a well-developed and well-nourished  in no acute distress and cooperative with exam. Alert and oriented x3.  Head: Normocephalic and atraumatic Mouth: no erythema or exudates, dry MM Eyes: PERRL, EOMI, conjunctivae normal, No scleral icterus.  Neck: Supple, Trachea midline normal ROM, No JVD, mass, thyromegaly, or carotid bruit present.  Cardiovascular: RRR,  S1 normal, S2 normal, no MRG, pulses symmetric and intact bilaterally Pulmonary/Chest: normal respiratory effort, CTAB, no wheezes, rales, or rhonchi Abdominal: Soft. Mild right sided tenderness, greater left sided tenderness, no rebound, non-distended, bowel sounds are normal, no masses palpable, organomegaly, or guarding present.  Extremities: No cyanosis and no edema  Neurological: A&O x3, Strength is  normal and symmetric bilaterally, cranial nerve II-XII are grossly intact, no focal motor deficit, sensory intact to light touch bilaterally.  Skin: Warm, dry and intact. No rash, cyanosis, or clubbing.  Psychiatric: Normal mood and affect. speech and behavior is normal. J              Labs on Admission:  Basic Metabolic Panel:  Recent Labs Lab 09/15/13 0930  NA 138  K 3.9  CL 100  CO2 18*  GLUCOSE 109*  BUN 87*  CREATININE 4.28*  CALCIUM 8.9   Liver Function Tests: No results found for this basename: AST, ALT, ALKPHOS, BILITOT, PROT, ALBUMIN,  in the last 168 hours No results found for this basename: LIPASE, AMYLASE,  in the last 168 hours No results found for this basename: AMMONIA,  in the last 168 hours CBC:  Recent Labs Lab 09/15/13 0930  WBC 15.7*  HGB 11.2*  HCT 31.9*  MCV 92.5  PLT 87*   Cardiac Enzymes: No results found for this basename: CKTOTAL, CKMB, CKMBINDEX, TROPONINI,  in the last 168 hours  BNP (last 3 results) No results found for this basename: PROBNP,  in the last 8760 hours CBG: No results found for this basename: GLUCAP,  in the last 168 hours  Radiological Exams on Admission: Ct Abdomen Pelvis Wo Contrast  09/15/2013   CLINICAL DATA:  General body aches and. Emesis. Left-sided flank pain.  EXAM: CT ABDOMEN AND PELVIS WITHOUT CONTRAST  TECHNIQUE: Multidetector CT imaging of the abdomen and pelvis was performed following the standard protocol without intravenous contrast.  COMPARISON:  01/28/2007 CT abdomen pelvis.  FINDINGS: Basilar scarring/ subsegmental atelectasis.  Markedly enlarged prostate gland with asymmetric extension to the right. This may be invading the right bladder wall at the level of the right ureterovesical junction contributing to prominent right-sided hydroureteronephrosis. This highly suspicious for prostate carcinoma. Pelvic adenopathy greatest right operator region measuring 3.7 x 2 cm. Right retrocrural slightly prominent  size lymph node. Para-aortic and interaortic caval adenopathy. Multiple sclerotic foci involving the ileum bilaterally, sacrum, L3, L2 and T12 suspicious for sclerotic metastatic disease.  Tiny nonobstructing renal calculi.  Taking into account limitation by non contrast imaging, no worrisome of hepatic, splenic, pancreatic, renal or adrenal lesion. No calcified gallstones.  Scattered colonic diverticula without extra luminal bowel inflammatory process, free fluid or free air.  Ankylosis L2 through S1.  Degenerative changes above this region.  Atherosclerotic type changes aorta with marked ectasia without focal aneurysm. Atherosclerotic type changes with ectasia common iliac arteries. Atherosclerotic type changes aortic branch vessels with ectatic tortuous splenic artery.  IMPRESSION: Findings highly suspicious for prostate cancer with with invasion of the right bladder wall causing marked right-sided hydronephrosis. Metastatic disease suspected as indicated by adenopathy and the osseous sclerotic foci as noted above.  Atherosclerotic type changes aorta and common iliac arteries.  These results were called by telephone at the time of interpretation on 09/15/2013 at 11:48 AM to Dr. Murlean Caller , who verbally acknowledged these results.   Electronically Signed   By: Chauncey Cruel M.D.   On: 09/15/2013 11:46   Dg Chest 2 View  09/15/2013  CLINICAL DATA:  Elevated WBC, weakness, confusion  EXAM: CHEST  2 VIEW  COMPARISON:  01/23/2010  FINDINGS: Cardiomediastinal silhouette is stable. No acute infiltrate or pleural effusion. No pulmonary edema. Mild degenerative changes thoracic spine.  IMPRESSION: No active cardiopulmonary disease.   Electronically Signed   By: Lahoma Crocker M.D.   On: 09/15/2013 11:18      Assessment/Plan Active Problems:   Acute renal failure -Likely secondary to obstructive uropathy and component of volume depletion -place foley Catheter, I have consulted urology>> Dr. Aline Brochure to  see [facility] with IV fluids -Follow and recheck   R.Hydronephrosis -As discussed above secondary to probable prostate mass/ca -Please Foley catheter, urology to see as above for possible nephrostomy Nausea or vomiting -Likely due to uremia/elevated BUN  -Follow with hydration Leukocytosis, unspecified -Urinalysis and chest x-ray negative -Possibly reactive, follow and recheck he is hemodynamically stable and afebrile   Prostate mass/probable prostate cancer with lymphadenopathy and sclerotic lesions -Urology consulted as above -Check PSA follow H/o Gout -Continue allopurinol, renal dose History of anxiety/depression -Continue Celexa Recent URI -Continue Z-Pak      Code Status: full Family Communication: Wife and sister at bedside Disposition Plan: Admit to [facility]  Time spent: >33mins  [facility] Pager [phone]

## 2013-09-15 NOTE — Consult Note (Signed)
I was asked to see [name] by Dr. Gaetano Net, MD for evaluation and management of prostate mass and right hydronephrosis.  ID: 39M admitted with four days of vomiting, weakness, and left sided groin/pelvic pain.  HPI: This is a 80 year old male who presented to the emergency department with 4 days of progressive weakness, nausea/vomiting and worsening left-sided groin and abdominal pain. In the course of the patient's workup for these symptoms he was found to have an elevated creatinine and underwent a CT of the abdomen and pelvis. The CAT scan revealed an irregularly shaped prostate, which is lobulated with scattered calcifications, and appears to be growing into the bladder wall and invading the right ureter. In addition there is a large pelvic lymph node measuring approximately 3 cm as well as sclerotic bone lesions in the thoracic and lumbar spine as well as iliac crests. which is concerning for metastatic disease.    The patient relates a history of progressive lower urinary tract symptoms including urinary frequency and urge/urge incontinence that has progressed over the last 6 weeks. He denies a history of hematuria. He denies any back or bone pain. He denies any lower showing swelling. The patient has a family history of prostate cancer, his dad had aggressive prostate cancer, although he also had lung cancer simultaneously and appears to have died from his lung cancer. The patient tells me that he has had routine prostate screening up until about 3 years ago. The patient denies any changes in his bowel habits. Patient denies any neurological changes or no neurological deficits.   Review of systems: The patient has limited mobility secondary to a right foot surgery several years ago. The patient denied any fevers, weight loss or night sweats. The remaining 12 point conference of review of systems is negative.  Patient Active Problem List   Diagnosis Date Noted  . Acute renal failure  09/15/2013  . Leukocytosis, unspecified 09/15/2013  . Nausea and vomiting 09/15/2013  . Abdominal pain, unspecified site 09/15/2013  . ARF (acute renal failure) 09/15/2013  . Prostate mass 09/15/2013  . Hydronephrosis 09/15/2013   No current facility-administered medications on file prior to encounter.   No current outpatient prescriptions on file prior to encounter.   Past Surgical History  Procedure Laterality Date  . Plantar's wart excision Bilateral   . Tonsillectomy      "when I was a kid" (09/15/2013)  . Cataract extraction w/ intraocular lens  implant, bilateral Bilateral    History   Social History  . Marital Status: Married    Spouse Name: N/A    Number of Children: N/A  . Years of Education: N/A   Occupational History  . Not on file.   Social History Main Topics  . Smoking status: Never Smoker   . Smokeless tobacco: Never Used  . Alcohol Use: 12.6 oz/week    21 Shots of liquor per week     Comment: 09/15/2013 "3-4 mixed drinks/day; not so much since the end of 2014"  . Drug Use: No  . Sexual Activity: Yes   Other Topics Concern  . Not on file   Social History Narrative  . No narrative on file   FH: father with prostate cancer  PE: Filed Vitals:   09/15/13 1330 09/15/13 1400 09/15/13 1500 09/15/13 1715  BP: 103/81 124/68 138/70 148/74  Pulse:  78 78 79  Temp:    98 F (36.7 C)  TempSrc:    Oral  Resp:   18 18  SpO2:  100% 100% 99%   NAD, alert and oriented x3 Normo-cephalic atraumatic head, good dentition No cervical or supraclavicular lymphadenopathy, there is no appreciable inguinal lymphadenopathy either Normal respiratory effort, no audible wheezing Heart rate is regular, no significant peripheral edema Abdomen is soft nontender nondistended, there is no CVA tenderness, the kidneys are nonpalpable, there is no suprapubic tenderness. The patient does have point tenderness in the left groin region inferior to the ilioinguinal ligament. Normal  male phenotypic genitalia, urethral meatus and a orthotopic, no scrotal or penile lesions, testicles are of normal size The patient's prostate is grossly abnormal. It is not feel terribly enlarged, but it is very hard and nodular diffusely, there appears to be extracapsular extension on the right lateral aspect. Lower extremities are symmetric without significant edema   Recent Labs  09/15/13 0930  WBC 15.7*  HGB 11.2*  HCT 31.9*    Recent Labs  09/15/13 0930  NA 138  K 3.9  CL 100  CO2 18*  GLUCOSE 109*  BUN 87*  CREATININE 4.28*  CALCIUM 8.9   No results found for this basename: LABPT, INR,  in the last 72 hours No results found for this basename: LABURIN,  in the last 72 hours Results for orders placed during the hospital encounter of 10/15/08  CULTURE, ROUTINE-ABSCESS     Status: None   Collection Time    10/15/08  9:53 PM      Result Value Range Status   Specimen Description ABSCESS FOOT RIGHT   Final   Special Requests NONE   Final   Gram Stain     Final   Value: RARE WBC PRESENT, PREDOMINANTLY PMN     NO SQUAMOUS EPITHELIAL CELLS SEEN     MODERATE GRAM NEGATIVE RODS     MODERATE GRAM POSITIVE COCCI     IN PAIRS IN CLUSTERS   Culture     Final   Value: MODERATE DIPHTHEROIDS(CORYNEBACTERIUM SPECIES)     Note: Standardized susceptibility testing for this organism is not available.     ABUNDANT PREVOTELLA BIVIA     Note: BETA LACTAMASE POSITIVE   Report Status 10/20/2008 FINAL   Final  CULTURE, BLOOD (ROUTINE X 2)     Status: None   Collection Time    10/16/08  4:10 PM      Result Value Range Status   Specimen Description BLOOD LEFT HAND   Final   Special Requests BOTTLES DRAWN AEROBIC AND ANAEROBIC 10CC   Final   Culture NO GROWTH 5 DAYS   Final   Report Status 10/22/2008 FINAL   Final  CULTURE, BLOOD (ROUTINE X 2)     Status: None   Collection Time    10/16/08  4:15 PM      Result Value Range Status   Specimen Description BLOOD LEFT HAND   Final    Special Requests BOTTLES DRAWN AEROBIC ONLY 2CC   Final   Culture NO GROWTH 5 DAYS   Final   Report Status 10/22/2008 FINAL   Final   PSA pending Alkaline phosphatase pending  Imaging: I have personally reviewed the patient's noncontrast abdomen and pelvis CT scan.  Findings: Markedly enlarged prostate gland with asymmetric extension to the  right. This may be invading the right bladder wall at the level of  the right ureterovesical junction contributing to prominent  right-sided hydroureteronephrosis. This highly suspicious for  prostate carcinoma. Pelvic adenopathy greatest right operator region  measuring 3.7 x 2 cm. Right retrocrural slightly prominent  size  lymph node. Para-aortic and interaortic caval adenopathy. Multiple  sclerotic foci involving the ileum bilaterally, sacrum, L3, L2 and  T12 suspicious for sclerotic metastatic disease.   Impression: Overall the patient's physical exam findings, CT scan and symptoms are consistent with aggressive metastatic prostate cancer. The patient's PSA is pending however, an aggressive prostate cancer the PSA can often be low and mis-leading. The patient's right-sided hydronephrosis is most likely related to the local extension from the prostate cancer into the bladder trigone and obstruction of the right distal ureter. The patient's creatinine is significantly elevated from 3 years ago, this may be in large part due to the obstructed right kidney although there may also be a prerenal component as well as progressive intrinsic renal disease over the last several years.   Recommendation: I sat down and had a discussion with the patient about all the physical exam findings as well as the findings of CAT scan and his elevated creatinine. I told him that the most likely scenario to tie all these findings together would be aggressive metastatic prostate cancer. However, the patient will need a prostate biopsy to confirm this. He will also need a  nuclear bone scan to complete his metastatic survey.  I also discussed with them the need for placement of a right percutaneous nephrostomy tube. He understands that this is the most pressing matter currently. We typically perform prostate biopsies in the outpatient setting in our clinic. Obtaining a biopsy in the [facility] hospital would be exceedingly difficult. However, obtaining a nuclear bone scan while inpatient would help facilitate his workup and would be convenient for the patient.  Recommendation summary (orders have not been placed): Recommend a right-sided percutaneous nephrostomy tube Obtained a nuclear bone scan Followup on PSA and alkaline phosphatase Will schedule patient for a prostate biopsy as an outpatient in our clinic sometime early next week.  I will continue to follow along with this patient while the patient is hospitalized and available as needed.  Thank you for this interesting consult.   Cc Dr. Lavone Orn, M.D.

## 2013-09-15 NOTE — ED Notes (Signed)
Pt. Has a c/o generalized weakness since Friday. Pt. Has c/o flank pain that radiated to his abdomen, groin, and now into his left leg. Pt. Has c/o vomiting as well.  Pt. denies any injuries.

## 2013-09-16 ENCOUNTER — Inpatient Hospital Stay (HOSPITAL_COMMUNITY): Payer: Medicare Other

## 2013-09-16 LAB — PROTIME-INR
INR: 1.25 (ref 0.00–1.49)
Prothrombin Time: 15.4 seconds — ABNORMAL HIGH (ref 11.6–15.2)

## 2013-09-16 LAB — CBC
HEMATOCRIT: 29.6 % — AB (ref 39.0–52.0)
Hemoglobin: 10.2 g/dL — ABNORMAL LOW (ref 13.0–17.0)
MCH: 32.6 pg (ref 26.0–34.0)
MCHC: 34.5 g/dL (ref 30.0–36.0)
MCV: 94.6 fL (ref 78.0–100.0)
Platelets: 88 10*3/uL — ABNORMAL LOW (ref 150–400)
RBC: 3.13 MIL/uL — ABNORMAL LOW (ref 4.22–5.81)
RDW: 14.6 % (ref 11.5–15.5)
WBC: 11.7 10*3/uL — AB (ref 4.0–10.5)

## 2013-09-16 LAB — BASIC METABOLIC PANEL
BUN: 81 mg/dL — ABNORMAL HIGH (ref 6–23)
CO2: 20 meq/L (ref 19–32)
CREATININE: 3.79 mg/dL — AB (ref 0.50–1.35)
Calcium: 8.9 mg/dL (ref 8.4–10.5)
Chloride: 102 mEq/L (ref 96–112)
GFR calc Af Amer: 16 mL/min — ABNORMAL LOW (ref >90)
GFR calc non Af Amer: 14 mL/min — ABNORMAL LOW (ref >90)
GLUCOSE: 84 mg/dL (ref 70–99)
Potassium: 3.7 mEq/L (ref 3.7–5.3)
Sodium: 137 mEq/L (ref 137–147)

## 2013-09-16 LAB — PSA: PSA: 4524 ng/mL — ABNORMAL HIGH (ref <=4.00)

## 2013-09-16 LAB — APTT: aPTT: 33 seconds (ref 24–37)

## 2013-09-16 MED ORDER — MIDAZOLAM HCL 2 MG/2ML IJ SOLN
INTRAMUSCULAR | Status: AC
  Filled 2013-09-16: qty 4

## 2013-09-16 MED ORDER — FENTANYL CITRATE 0.05 MG/ML IJ SOLN
INTRAMUSCULAR | Status: AC
  Filled 2013-09-16: qty 4

## 2013-09-16 MED ORDER — IOHEXOL 300 MG/ML  SOLN
50.0000 mL | Freq: Once | INTRAMUSCULAR | Status: AC | PRN
  Administered 2013-09-16: 10 mL

## 2013-09-16 MED ORDER — FENTANYL CITRATE 0.05 MG/ML IJ SOLN
INTRAMUSCULAR | Status: AC | PRN
  Administered 2013-09-16: 25 ug via INTRAVENOUS

## 2013-09-16 MED ORDER — CIPROFLOXACIN IN D5W 400 MG/200ML IV SOLN
400.0000 mg | Freq: Once | INTRAVENOUS | Status: AC
  Administered 2013-09-16: 400 mg via INTRAVENOUS
  Filled 2013-09-16: qty 200

## 2013-09-16 MED ORDER — MIDAZOLAM HCL 2 MG/2ML IJ SOLN
INTRAMUSCULAR | Status: AC | PRN
  Administered 2013-09-16: 12:00:00 1 mg via INTRAVENOUS

## 2013-09-16 NOTE — Care Management Note (Signed)
    Page 1 of 2   09/18/2013     1:28:28 PM   CARE MANAGEMENT NOTE 09/18/2013  Patient:  Michael Estes, Michael Estes   Account Number:  [id number]  Date Initiated:  09/16/2013  Documentation initiated by:  Tomi Bamberger  Subjective/Objective Assessment:   dx leukocytosis  admit-lives with family.     Action/Plan:   pt eval-hhpt   Anticipated DC Date:  09/18/2013   Anticipated DC Plan:  [place]  CM consult      [facility]  HOME HEALTH   Choice offered to / List presented to:  C-1 Patient        [place] arranged  HH-2 PT      [facility]   Status of service:  Completed, signed off Medicare Important Message given?   (If response is "NO", the following Medicare IM given date fields will be blank) Date Medicare IM given:   Date Additional Medicare IM given:    Discharge Disposition:  [place]  Per UR Regulation:  Reviewed for med. necessity/level of care/duration of stay  If discussed at [place] of Stay Meetings, dates discussed:    Comments:  09/18/13 13:27 Tomi Bamberger RN, BSN [phone] patient dc to home, [facility] with [facility] notified.  09/17/13 14:47 Tomi Bamberger RN, BSN [phone] patient lives with spouse, NCM received referral for hhpt, patient chose [place] from agency list, referral made to [facility] notified.  Soc will  begin 24-48 hrs post discharge.  09/16/13 Tomi Bamberger RN, BSN [phone] patient lives with family, await pt eval.  NCM will continue to follow for dc needs.

## 2013-09-16 NOTE — Progress Notes (Signed)
Subjective: Feels better, no nausea.  Objective: Vital signs in last 24 hours: Temp:  [97.7 F (36.5 C)-98.3 F (36.8 C)] 97.8 F (36.6 C) (01/14 0500) Pulse Rate:  [76-101] 78 (01/14 0500) Resp:  [18-20] 18 (01/14 0500) BP: (103-148)/(61-81) 136/74 mmHg (01/14 0500) SpO2:  [97 %-100 %] 100 % (01/14 0500) Weight:  [81.375 kg (179 lb 6.4 oz)] 81.375 kg (179 lb 6.4 oz) (01/13 2243) Weight change:  Last BM Date: 09/15/13  Intake/Output from previous day:   Intake/Output this shift:    General appearance: alert and cooperative Resp: clear to auscultation bilaterally Cardio: regular rate and rhythm, S1, S2 normal, no murmur, click, rub or gallop GI: soft, non-tender; bowel sounds normal; no masses,  no organomegaly Extremities: extremities normal, atraumatic, no cyanosis or edema  Lab Results:  Recent Labs  09/15/13 0930 09/16/13 0525  WBC 15.7* 11.7*  HGB 11.2* 10.2*  HCT 31.9* 29.6*  PLT 87* 88*   BMET  Recent Labs  09/15/13 0930 09/16/13 0525  NA 138 137  K 3.9 3.7  CL 100 102  CO2 18* 20  GLUCOSE 109* 84  BUN 87* 81*  CREATININE 4.28* 3.79*  CALCIUM 8.9 8.9    Studies/Results: Ct Abdomen Pelvis Wo Contrast  09/15/2013   CLINICAL DATA:  General body aches and. Emesis. Left-sided flank pain.  EXAM: CT ABDOMEN AND PELVIS WITHOUT CONTRAST  TECHNIQUE: Multidetector CT imaging of the abdomen and pelvis was performed following the standard protocol without intravenous contrast.  COMPARISON:  01/28/2007 CT abdomen pelvis.  FINDINGS: Basilar scarring/ subsegmental atelectasis.  Markedly enlarged prostate gland with asymmetric extension to the right. This may be invading the right bladder wall at the level of the right ureterovesical junction contributing to prominent right-sided hydroureteronephrosis. This highly suspicious for prostate carcinoma. Pelvic adenopathy greatest right operator region measuring 3.7 x 2 cm. Right retrocrural slightly prominent size lymph node.  Para-aortic and interaortic caval adenopathy. Multiple sclerotic foci involving the ileum bilaterally, sacrum, L3, L2 and T12 suspicious for sclerotic metastatic disease.  Tiny nonobstructing renal calculi.  Taking into account limitation by non contrast imaging, no worrisome of hepatic, splenic, pancreatic, renal or adrenal lesion. No calcified gallstones.  Scattered colonic diverticula without extra luminal bowel inflammatory process, free fluid or free air.  Ankylosis L2 through S1.  Degenerative changes above this region.  Atherosclerotic type changes aorta with marked ectasia without focal aneurysm. Atherosclerotic type changes with ectasia common iliac arteries. Atherosclerotic type changes aortic branch vessels with ectatic tortuous splenic artery.  IMPRESSION: Findings highly suspicious for prostate cancer with with invasion of the right bladder wall causing marked right-sided hydronephrosis. Metastatic disease suspected as indicated by adenopathy and the osseous sclerotic foci as noted above.  Atherosclerotic type changes aorta and common iliac arteries.  These results were called by telephone at the time of interpretation on 09/15/2013 at 11:48 AM to Dr. Murlean Caller , who verbally acknowledged these results.   Electronically Signed   By: Chauncey Cruel M.D.   On: 09/15/2013 11:46   Dg Chest 2 View  09/15/2013   CLINICAL DATA:  Elevated WBC, weakness, confusion  EXAM: CHEST  2 VIEW  COMPARISON:  01/23/2010  FINDINGS: Cardiomediastinal silhouette is stable. No acute infiltrate or pleural effusion. No pulmonary edema. Mild degenerative changes thoracic spine.  IMPRESSION: No active cardiopulmonary disease.   Electronically Signed   By: Lahoma Crocker M.D.   On: 09/15/2013 11:18   US Renal  09/15/2013   CLINICAL DATA:  Elevated creatinine  EXAM: RENAL/URINARY TRACT ULTRASOUND COMPLETE  COMPARISON:  CT from earlier in the same day.  FINDINGS: Right Kidney:  Length: 11.2 cm in length. Hydronephrosis with  prominent ureter is noted similar to that seen on recent CT.  Left Kidney:  Length: 12.2 cm. Echogenicity within normal limits. No mass or hydronephrosis visualized.  Bladder:  Bladder is well distended. Significant enlargement of the prostate (6.3 x 4.1 x 5.1 cm) is noted similar to that seen on recent CT examination. Bilateral ureteral jets are noted however.  IMPRESSION: Right-sided hydronephrosis and hydroureter consistent with that seen on recent CT examination.  Enlarged prostate similar to that seen on recent CT examination highly suggestive of prostate carcinoma with invasion into the bladder.   Electronically Signed   By: Inez Catalina M.D.   On: 09/15/2013 19:21    Medications: I have reviewed the patient's current medications.  Assessment/Plan: Active Problems:   Acute renal failure  Some improvement in creatinine, continue IVFS, plan is for nephrostomy tube    Leukocytosis, improving, likely reactive   Nausea and vomiting resolved   Abdominal pain, unspecified site improved   Prostate cancer, metastatic, appreciate urology input, bone scan, biopsy as outpatient  Right Hydronephrosis secondary to prostate cancer, plan is for R percutaneous nephrostomy tube    LOS: 1 day   Michael Estes 09/16/2013, 7:47 AM

## 2013-09-16 NOTE — Progress Notes (Signed)
Utilization review completed.  

## 2013-09-16 NOTE — H&P (Signed)
Michael Estes is an 80 y.o. male.   Chief Complaint: pt admitted from ER with N/V/ weakness and L pelvic/flank pain. Work up reveals elevated renal fxns and Rt hydronephrosis with prostate mass Pt seen by Urology who has requested R percutaneous nephrostomy placement Plt 88 today  HPI: gout; prostate mass; HOH  Past Medical History  Diagnosis Date  . Gout   . Seasonal allergies   . Anxiety   . Enlarged prostate     Archie Endo 09/15/2013  . HOH (hard of hearing)     "both ears" (09/15/2013)  . Arthritis     "slightly, recently, upper legs" (09/15/2013)    Past Surgical History  Procedure Laterality Date  . Plantar's wart excision Bilateral   . Tonsillectomy      "when I was a kid" (09/15/2013)  . Cataract extraction w/ intraocular lens  implant, bilateral Bilateral     History reviewed. No pertinent family history. Social History:  reports that he has never smoked. He has never used smokeless tobacco. He reports that he drinks about 12.6 ounces of alcohol per week. He reports that he does not use illicit drugs.  Allergies: No Known Allergies  Medications Prior to Admission  Medication Sig Dispense Refill  . allopurinol (ZYLOPRIM) 300 MG tablet Take 300 mg by mouth daily.      Marland Kitchen azithromycin (ZITHROMAX) 250 MG tablet Take 250-500 mg by mouth daily. Taking 500 mg for one day, then 236m for 4 days.  Started on 09-14-13.      . cetirizine (ZYRTEC) 10 MG tablet Take 10 mg by mouth daily.      . Cholecalciferol (VITAMIN D PO) Take 1 tablet by mouth daily.      . citalopram (CELEXA) 20 MG tablet Take 20 mg by mouth daily.      . promethazine (PHENERGAN) 25 MG tablet Take 25-50 mg by mouth every 6 (six) hours as needed for nausea or vomiting.        Results for orders placed during the hospital encounter of 09/15/13 (from the past 48 hour(s))  CBC     Status: Abnormal   Collection Time    09/15/13  9:30 AM      Result Value Range   WBC 15.7 (*) 4.0 - 10.5 K/uL   RBC 3.45 (*) 4.22  - 5.81 MIL/uL   Hemoglobin 11.2 (*) 13.0 - 17.0 g/dL   HCT 31.9 (*) 39.0 - 52.0 %   MCV 92.5  78.0 - 100.0 fL   MCH 32.5  26.0 - 34.0 pg   MCHC 35.1  30.0 - 36.0 g/dL   RDW 14.6  11.5 - 15.5 %   Platelets 87 (*) 150 - 400 K/uL   Comment: REPEATED TO VERIFY     PLATELET COUNT CONFIRMED BY SMEAR  BASIC METABOLIC PANEL     Status: Abnormal   Collection Time    09/15/13  9:30 AM      Result Value Range   Sodium 138  137 - 147 mEq/L   Potassium 3.9  3.7 - 5.3 mEq/L   Chloride 100  96 - 112 mEq/L   CO2 18 (*) 19 - 32 mEq/L   Glucose, Bld 109 (*) 70 - 99 mg/dL   BUN 87 (*) 6 - 23 mg/dL   Creatinine, Ser 4.28 (*) 0.50 - 1.35 mg/dL   Calcium 8.9  8.4 - 10.5 mg/dL   GFR calc non Af Amer 12 (*) >90 mL/min   GFR calc Af AWyvonnia Lora  14 (*) >90 mL/min   Comment: (NOTE)     The eGFR has been calculated using the CKD EPI equation.     This calculation has not been validated in all clinical situations.     eGFR's persistently <90 mL/min signify possible Chronic Kidney     Disease.  CG4 I-STAT (LACTIC ACID)     Status: None   Collection Time    09/15/13 10:18 AM      Result Value Range   Lactic Acid, Venous 1.33  0.5 - 2.2 mmol/L  URINALYSIS, ROUTINE W REFLEX MICROSCOPIC     Status: Abnormal   Collection Time    09/15/13 12:35 PM      Result Value Range   Color, Urine YELLOW  YELLOW   APPearance CLOUDY (*) CLEAR   Specific Gravity, Urine 1.015  1.005 - 1.030   pH 5.0  5.0 - 8.0   Glucose, UA NEGATIVE  NEGATIVE mg/dL   Hgb urine dipstick MODERATE (*) NEGATIVE   Bilirubin Urine NEGATIVE  NEGATIVE   Ketones, ur NEGATIVE  NEGATIVE mg/dL   Protein, ur NEGATIVE  NEGATIVE mg/dL   Urobilinogen, UA 0.2  0.0 - 1.0 mg/dL   Nitrite NEGATIVE  NEGATIVE   Leukocytes, UA NEGATIVE  NEGATIVE  URINE MICROSCOPIC-ADD ON     Status: None   Collection Time    09/15/13 12:35 PM      Result Value Range   Squamous Epithelial / LPF RARE  RARE   WBC, UA 0-2  <3 WBC/hpf   RBC / HPF 3-6  <3 RBC/hpf   Bacteria, UA  RARE  RARE  HEPATIC FUNCTION PANEL     Status: Abnormal   Collection Time    09/15/13  8:35 PM      Result Value Range   Total Protein 6.0  6.0 - 8.3 g/dL   Albumin 2.8 (*) 3.5 - 5.2 g/dL   AST 38 (*) 0 - 37 U/L   ALT 18  0 - 53 U/L   Alkaline Phosphatase 156 (*) 39 - 117 U/L   Total Bilirubin 1.2  0.3 - 1.2 mg/dL   Bilirubin, Direct 0.4 (*) 0.0 - 0.3 mg/dL   Indirect Bilirubin 0.8  0.3 - 0.9 mg/dL  PSA     Status: Abnormal   Collection Time    09/15/13  8:35 PM      Result Value Range   PSA 4524.00 (*) <=4.00 ng/mL   Comment: (NOTE)     Result repeated and verified.     Result confirmed by automatic dilution.     Test Methodology: ECLIA PSA (Electrochemiluminescence Immunoassay)     For PSA values from 2.5-4.0, particularly in younger men <60 years     old, the AUA and NCCN suggest testing for % Free PSA (3515) and     evaluation of the rate of increase in PSA (PSA velocity).     Performed at [facility]     Status: Abnormal   Collection Time    09/16/13  5:25 AM      Result Value Range   Sodium 137  137 - 147 mEq/L   Potassium 3.7  3.7 - 5.3 mEq/L   Chloride 102  96 - 112 mEq/L   CO2 20  19 - 32 mEq/L   Glucose, Bld 84  70 - 99 mg/dL   BUN 81 (*) 6 - 23 mg/dL   Creatinine, Ser 3.79 (*) 0.50 - 1.35 mg/dL  Calcium 8.9  8.4 - 10.5 mg/dL   GFR calc non Af Amer 14 (*) >90 mL/min   GFR calc Af Amer 16 (*) >90 mL/min   Comment: (NOTE)     The eGFR has been calculated using the CKD EPI equation.     This calculation has not been validated in all clinical situations.     eGFR's persistently <90 mL/min signify possible Chronic Kidney     Disease.  CBC     Status: Abnormal   Collection Time    09/16/13  5:25 AM      Result Value Range   WBC 11.7 (*) 4.0 - 10.5 K/uL   RBC 3.13 (*) 4.22 - 5.81 MIL/uL   Hemoglobin 10.2 (*) 13.0 - 17.0 g/dL   HCT 29.6 (*) 39.0 - 52.0 %   MCV 94.6  78.0 - 100.0 fL   MCH 32.6  26.0 - 34.0 pg   MCHC 34.5  30.0 -  36.0 g/dL   RDW 14.6  11.5 - 15.5 %   Platelets 88 (*) 150 - 400 K/uL   Comment: CONSISTENT WITH PREVIOUS RESULT   Ct Abdomen Pelvis Wo Contrast  09/15/2013   CLINICAL DATA:  General body aches and. Emesis. Left-sided flank pain.  EXAM: CT ABDOMEN AND PELVIS WITHOUT CONTRAST  TECHNIQUE: Multidetector CT imaging of the abdomen and pelvis was performed following the standard protocol without intravenous contrast.  COMPARISON:  01/28/2007 CT abdomen pelvis.  FINDINGS: Basilar scarring/ subsegmental atelectasis.  Markedly enlarged prostate gland with asymmetric extension to the right. This may be invading the right bladder wall at the level of the right ureterovesical junction contributing to prominent right-sided hydroureteronephrosis. This highly suspicious for prostate carcinoma. Pelvic adenopathy greatest right operator region measuring 3.7 x 2 cm. Right retrocrural slightly prominent size lymph node. Para-aortic and interaortic caval adenopathy. Multiple sclerotic foci involving the ileum bilaterally, sacrum, L3, L2 and T12 suspicious for sclerotic metastatic disease.  Tiny nonobstructing renal calculi.  Taking into account limitation by non contrast imaging, no worrisome of hepatic, splenic, pancreatic, renal or adrenal lesion. No calcified gallstones.  Scattered colonic diverticula without extra luminal bowel inflammatory process, free fluid or free air.  Ankylosis L2 through S1.  Degenerative changes above this region.  Atherosclerotic type changes aorta with marked ectasia without focal aneurysm. Atherosclerotic type changes with ectasia common iliac arteries. Atherosclerotic type changes aortic branch vessels with ectatic tortuous splenic artery.  IMPRESSION: Findings highly suspicious for prostate cancer with with invasion of the right bladder wall causing marked right-sided hydronephrosis. Metastatic disease suspected as indicated by adenopathy and the osseous sclerotic foci as noted above.   Atherosclerotic type changes aorta and common iliac arteries.  These results were called by telephone at the time of interpretation on 09/15/2013 at 11:48 AM to Dr. Murlean Caller , who verbally acknowledged these results.   Electronically Signed   By: Chauncey Cruel M.D.   On: 09/15/2013 11:46   Dg Chest 2 View  09/15/2013   CLINICAL DATA:  Elevated WBC, weakness, confusion  EXAM: CHEST  2 VIEW  COMPARISON:  01/23/2010  FINDINGS: Cardiomediastinal silhouette is stable. No acute infiltrate or pleural effusion. No pulmonary edema. Mild degenerative changes thoracic spine.  IMPRESSION: No active cardiopulmonary disease.   Electronically Signed   By: Lahoma Crocker M.D.   On: 09/15/2013 11:18   US Renal  09/15/2013   CLINICAL DATA:  Elevated creatinine  EXAM: RENAL/URINARY TRACT ULTRASOUND COMPLETE  COMPARISON:  CT from earlier in the  same day.  FINDINGS: Right Kidney:  Length: 11.2 cm in length. Hydronephrosis with prominent ureter is noted similar to that seen on recent CT.  Left Kidney:  Length: 12.2 cm. Echogenicity within normal limits. No mass or hydronephrosis visualized.  Bladder:  Bladder is well distended. Significant enlargement of the prostate (6.3 x 4.1 x 5.1 cm) is noted similar to that seen on recent CT examination. Bilateral ureteral jets are noted however.  IMPRESSION: Right-sided hydronephrosis and hydroureter consistent with that seen on recent CT examination.  Enlarged prostate similar to that seen on recent CT examination highly suggestive of prostate carcinoma with invasion into the bladder.   Electronically Signed   By: Inez Catalina M.D.   On: 09/15/2013 19:21    Review of Systems  Constitutional: Negative for fever and weight loss.  Respiratory: Negative for shortness of breath.   Cardiovascular: Negative for chest pain.  Gastrointestinal: Positive for nausea and vomiting. Negative for abdominal pain and diarrhea.  Genitourinary: Positive for dysuria and urgency.  Neurological: Positive  for weakness and headaches. Negative for dizziness.    Blood pressure 136/74, pulse 78, temperature 97.8 F (36.6 C), temperature source Oral, resp. rate 18, height 6' (1.829 m), weight 179 lb 6.4 oz (81.375 kg), SpO2 100.00%. Physical Exam  Constitutional: He is oriented to person, place, and time. He appears well-developed and well-nourished.  Cardiovascular: Normal rate.   No murmur heard. Respiratory: Effort normal.  GI: Bowel sounds are normal. There is no tenderness.  Neurological: He is alert and oriented to person, place, and time.  Skin: Skin is dry.  Psychiatric: He has a normal mood and affect. His behavior is normal. Judgment and thought content normal.     Assessment/Plan R hydronephrosis with prostate mass Elevated renal fxns plt 88 Pt scheduled for Rt PCN today in IR Pt aware of procedure benefits and risks and agreeable to proceed Consent signed and in chart Lovenox held Cipro IV on call  Sheridan A 09/16/2013, 8:29 AM

## 2013-09-16 NOTE — Progress Notes (Signed)
PT Cancellation Note  Patient Details Name: Michael Estes MRN: [id number] DOB: 1931/12/17   Cancelled Treatment:    Reason Eval/Treat Not Completed: Patient at procedure or test/unavailable.   Duncan Dull 09/16/2013, 12:35 PM Alben Deeds, [place] DPT  [phone]

## 2013-09-16 NOTE — Procedures (Signed)
Successful RT PCN INSERTION NO COMP STABLE KEEP TO GRAVITY DRAINAGE  ALSO, PLACED A US GUIDED IV RUE CEPHALIC VEIN BECAUSE OF NO IV ACCESS FOR ABX

## 2013-09-16 NOTE — Progress Notes (Signed)
INITIAL NUTRITION ASSESSMENT  DOCUMENTATION CODES Per approved criteria  -Severe malnutrition in the context of acute illness or injury   INTERVENTION: Add Ensure Complete po daily, each supplement provides 350 kcal and 13 grams of protein, once diet order permits. Diet advancement per team's discretion. RD to continue to follow nutrition care plan.  NUTRITION DIAGNOSIS: Inadequate oral intake related to GI distress and poor appetite as evidenced by poor meal completion and weight loss.   Goal: Intake to meet >90% of estimated nutrition needs.  Monitor:  weight trends, lab trends, I/O's, PO intake, supplement tolerance  Reason for Assessment: Malnutrition Screening Tool  80 y.o. male  Admitting Dx: metastatic prostate CA  ASSESSMENT: PMHx significant for gout and anxiety. Admitted with n/v x 4 days, and URI symptoms. CT scan and symptoms are consistent with aggressive metastatic prostate cancer.   Plan is for patient to get a R-sided percutaneous nephrostomy tube.  Pt's usual weight per wife is 190 lb. Pt weighed this <2 weeks ago. Last time pt ate anything was Friday, approximately 5 days ago. Pt has been limited to Clear Liquids since admission and feels as though he is ready to advance his diet. Feels hungry. Agreeable to Ensure daily once diet order permits.  Nutrition Focused Physical Exam:  Subcutaneous Fat:  Orbital Region: WNL Upper Arm Region: severe depletion Thoracic and Lumbar Region: moderate depletion  Muscle:  Temple Region: WNL Clavicle Bone Region: moderate depletion Clavicle and Acromion Bone Region: WNL Scapular Bone Region: n/a Dorsal Hand: n/a Patellar Region: WNL Anterior Thigh Region: moderate depletion Posterior Calf Region: severe depletion  Edema: n/a  Pt meets criteria for severe MALNUTRITION in the context of acute illness as evidenced by intake of <50% x at least 5 days and 6% wt loss x 2 weeks.   Height: Ht Readings from Last 1  Encounters:  09/15/13 6' (1.829 m)    Weight: Wt Readings from Last 1 Encounters:  09/15/13 179 lb 6.4 oz (81.375 kg)    Ideal Body Weight: 178 lb  % Ideal Body Weight: 101%  Wt Readings from Last 10 Encounters:  09/15/13 179 lb 6.4 oz (81.375 kg)    Usual Body Weight: 190 lb  % Usual Body Weight: 94%  BMI:  Body mass index is 24.33 kg/(m^2). Normal weight  Estimated Nutritional Needs: Kcal: 2000 - 2200 Protein: at least 105 grams protein Fluid: 2 - 2.2 liters daily  Skin: R foot puncture wound  Diet Order: NPO  EDUCATION NEEDS: -No education needs identified at this time  No intake or output data in the 24 hours ending 09/16/13 1037  Last BM: 1/14  Labs:   Recent Labs Lab 09/15/13 0930 09/16/13 0525  NA 138 137  K 3.9 3.7  CL 100 102  CO2 18* 20  BUN 87* 81*  CREATININE 4.28* 3.79*  CALCIUM 8.9 8.9  GLUCOSE 109* 84    CBG (last 3)  No results found for this basename: GLUCAP,  in the last 72 hours  Scheduled Meds: . allopurinol  100 mg Oral Daily  . azithromycin  250 mg Oral Daily  . ciprofloxacin  400 mg Intravenous Once  . citalopram  20 mg Oral Daily  . enoxaparin (LOVENOX) injection  30 mg Subcutaneous Q24H  . loratadine  10 mg Oral Daily  . pantoprazole (PROTONIX) IV  40 mg Intravenous Daily    Continuous Infusions: . sodium chloride 100 mL/hr at 09/15/13 2223    Past Medical History  Diagnosis Date  .  Gout   . Seasonal allergies   . Anxiety   . Enlarged prostate     Archie Endo 09/15/2013  . HOH (hard of hearing)     "both ears" (09/15/2013)  . Arthritis     "slightly, recently, upper legs" (09/15/2013)    Past Surgical History  Procedure Laterality Date  . Plantar's wart excision Bilateral   . Tonsillectomy      "when I was a kid" (09/15/2013)  . Cataract extraction w/ intraocular lens  implant, bilateral Bilateral     Inda Coke MS, RD, LDN Pager: [phone] After-hours pager: [phone],

## 2013-09-17 ENCOUNTER — Inpatient Hospital Stay (HOSPITAL_COMMUNITY): Payer: Medicare Other

## 2013-09-17 LAB — BASIC METABOLIC PANEL
BUN: 68 mg/dL — ABNORMAL HIGH (ref 6–23)
CO2: 18 mEq/L — ABNORMAL LOW (ref 19–32)
Calcium: 7.9 mg/dL — ABNORMAL LOW (ref 8.4–10.5)
Chloride: 107 mEq/L (ref 96–112)
Creatinine, Ser: 2.99 mg/dL — ABNORMAL HIGH (ref 0.50–1.35)
GFR calc Af Amer: 21 mL/min — ABNORMAL LOW (ref >90)
GFR, EST NON AFRICAN AMERICAN: 18 mL/min — AB (ref >90)
Glucose, Bld: 92 mg/dL (ref 70–99)
POTASSIUM: 3.6 meq/L — AB (ref 3.7–5.3)
Sodium: 141 mEq/L (ref 137–147)

## 2013-09-17 LAB — CBC
HEMATOCRIT: 24.1 % — AB (ref 39.0–52.0)
HEMOGLOBIN: 8.2 g/dL — AB (ref 13.0–17.0)
MCH: 32.3 pg (ref 26.0–34.0)
MCHC: 34 g/dL (ref 30.0–36.0)
MCV: 94.9 fL (ref 78.0–100.0)
Platelets: 85 10*3/uL — ABNORMAL LOW (ref 150–400)
RBC: 2.54 MIL/uL — ABNORMAL LOW (ref 4.22–5.81)
RDW: 14.5 % (ref 11.5–15.5)
WBC: 7.2 10*3/uL (ref 4.0–10.5)

## 2013-09-17 LAB — URINE CULTURE
COLONY COUNT: NO GROWTH
Culture: NO GROWTH

## 2013-09-17 LAB — IRON AND TIBC
IRON: 21 ug/dL — AB (ref 42–135)
Saturation Ratios: 10 % — ABNORMAL LOW (ref 20–55)
TIBC: 204 ug/dL — ABNORMAL LOW (ref 215–435)
UIBC: 183 ug/dL (ref 125–400)

## 2013-09-17 LAB — FERRITIN: Ferritin: 1403 ng/mL — ABNORMAL HIGH (ref 22–322)

## 2013-09-17 MED ORDER — ENSURE COMPLETE PO LIQD
237.0000 mL | ORAL | Status: DC
  Administered 2013-09-17 – 2013-09-18 (×2): 237 mL via ORAL

## 2013-09-17 MED ORDER — PANTOPRAZOLE SODIUM 40 MG PO TBEC
40.0000 mg | DELAYED_RELEASE_TABLET | Freq: Every day | ORAL | Status: DC
  Administered 2013-09-17 – 2013-09-18 (×2): 40 mg via ORAL
  Filled 2013-09-17 (×2): qty 1

## 2013-09-17 MED ORDER — LORAZEPAM 0.5 MG PO TABS: 0.5000 mg | ORAL_TABLET | Freq: Four times a day (QID) | ORAL | Status: DC | PRN

## 2013-09-17 MED ORDER — TECHNETIUM TC 99M MEDRONATE IV KIT
25.0000 | PACK | Freq: Once | INTRAVENOUS | Status: AC | PRN
  Administered 2013-09-17: 25 via INTRAVENOUS

## 2013-09-17 NOTE — Progress Notes (Signed)
Patient is having some anxiety after hearing the news about his condition. Patient is in room crying and stating that he can not believe that this is it for him. RN called on-call MD for doctor Laurann Montana- .5mg  of ativan has been ordered q6. Will continue to monitor

## 2013-09-17 NOTE — Evaluation (Signed)
Physical Therapy Evaluation Patient Details Name: Michael Estes MRN: [id number] DOB: 29-Jan-1932 Today's Date: 09/17/2013 Time: 2778-2423 PT Time Calculation (min): 20 min  PT Assessment / Plan / Recommendation History of Present Illness  Right Hydronephrosis secondary to prostate cancer,  R percutaneous nephrostomy tube placed.  Clinical Impression  Pt admitted with above. Pt currently with functional limitations due to the deficits listed below (see PT Problem List).  Pt will benefit from skilled PT to increase their independence and safety with mobility to allow discharge home with wife.        PT Assessment  Patient needs continued PT services    Follow Up Recommendations  Home health PT    Does the patient have the potential to tolerate intense rehabilitation      Barriers to Discharge        Equipment Recommendations  None recommended by PT    Recommendations for Other Services     Frequency Min 3X/week    Precautions / Restrictions Precautions Precautions: Fall Restrictions Weight Bearing Restrictions: No   Pertinent Vitals/Pain No c/o's      Mobility  Bed Mobility Overal bed mobility: Modified Independent General bed mobility comments: Incr time and use of rail with HOB elevated. Transfers Overall transfer level: Needs assistance Equipment used: None Transfers: Sit to/from Stand Sit to Stand: Min assist General transfer comment: Assist for balance Ambulation/Gait Ambulation/Gait assistance: Min assist Ambulation Distance (Feet): 300 Feet Assistive device: Rolling walker (2 wheeled) Gait Pattern/deviations: Step-through pattern;Decreased stride length;Trunk flexed General Gait Details: Verbal cues to stay closer to walker and stand more erect.    Exercises     PT Diagnosis: Difficulty walking;Generalized weakness  PT Problem List: Decreased strength;Decreased balance;Decreased mobility;Decreased knowledge of use of DME PT Treatment Interventions:  Gait training;DME instruction;Balance training;Patient/family education;Functional mobility training;Therapeutic activities;Therapeutic exercise     PT Goals(Current goals can be found in the care plan section) Acute Rehab PT Goals Patient Stated Goal: Home with wife PT Goal Formulation: With patient/family Time For Goal Achievement: 09/21/13 Potential to Achieve Goals: Good  Visit Information  Last PT Received On: 09/17/13 Assistance Needed: +1 History of Present Illness: Right Hydronephrosis secondary to prostate cancer,  R percutaneous nephrostomy tube placed.       Prior [place] expects to be discharged to:: Private residence Living Arrangements: Spouse/significant other Available Help at Discharge: Family;Available 24 hours/day Type of Home: House Home Access: Level entry Home Layout: One level Home Equipment: Walker - 2 wheels Prior Function Level of Independence: Independent Communication Communication: No difficulties    Cognition  Cognition Arousal/Alertness: Awake/alert Behavior During Therapy: WFL for tasks assessed/performed Overall Cognitive Status: Within Functional Limits for tasks assessed    Extremity/Trunk Assessment Upper Extremity Assessment Upper Extremity Assessment: Overall WFL for tasks assessed Lower Extremity Assessment Lower Extremity Assessment: Generalized weakness   Balance Balance Overall balance assessment: Needs assistance Standing balance support: No upper extremity supported Standing balance-Leahy Scale: Fair  End of Session PT - End of Session Activity Tolerance: Patient tolerated treatment well Patient left: in chair;with call bell/phone within reach;with family/visitor present Nurse Communication: Mobility status  GP     [facility] 09/17/2013, 11:53 AM  Suanne Marker PT [phone]

## 2013-09-17 NOTE — Consult Note (Signed)
Followup for consultation regarding patient's presumed diagnosis of metastatic prostate cancer.  Interval: Patient's PSA has returned at greater than 4500 and his bone scan has revealed diffuse metastatic lesions. Subjective: Patient continues to feel weak, does not feel much better. PE: Filed Vitals:   09/17/13 0520  BP: 120/64  Pulse: 81  Temp: 98.3 F (36.8 C)  Resp: 18   patient is in no acute distress  right nephrostomy tube is draining pink-colored urine  Intake/Output Summary (Last 24 hours) at 09/17/13 1852 Last data filed at 09/17/13 1700  Gross per 24 hour  Intake 2347.08 ml  Output    975 ml  Net 1372.08 ml    Recent Labs  09/15/13 0930 09/16/13 0525 09/17/13 0520  WBC 15.7* 11.7* 7.2  HGB 11.2* 10.2* 8.2*  HCT 31.9* 29.6* 24.1*    Recent Labs  09/15/13 0930 09/16/13 0525 09/17/13 0520  NA 138 137 141  K 3.9 3.7 3.6*  CL 100 102 107  CO2 18* 20 18*  GLUCOSE 109* 84 92  BUN 87* 81* 68*  CREATININE 4.28* 3.79* 2.99*  CALCIUM 8.9 8.9 7.9*    Recent Labs  09/16/13 0920  INR 1.25   No results found for this basename: LABURIN,  in the last 72 hours Results for orders placed during the hospital encounter of 09/15/13  URINE CULTURE     Status: None   Collection Time    09/16/13  2:26 PM      Result Value Range Status   Specimen Description URINE, RANDOM   Final   Special Requests NONE   Final   Culture  Setup Time     Final   Value: 09/16/2013 15:20     Performed at [facility]     Final   Value: NO GROWTH     Performed at [facility]   Culture     Final   Value: NO GROWTH     Performed at [facility]   Report Status 09/17/2013 FINAL   Final  ALP: 150 PSA: 4524 Bone Scan Findings:  Innumerable lesions are noted throughout the  skull , left clavicle, manubrium, bilateral ribs, thoracolumbar  spine , and pelvis. These findings consistent with extensive  metastatic disease.   Impression: No tissue  diagnosis, but patient likely with diffuse metastatic prostate cancer. Recommendation: Recommend leaving the nephrostomy tube open to gravity. Will discuss internalizing this when the patient returns to the clinic as an outpatient. I discussed the next step of confirming the diagnosis and treatment options for him. Our tentative plan is to proceed to the operating room the middle of next week for a biopsy under anesthesia to confirm the diagnosis followed by simple bilateral orchiectomy. I will set the patient up for this and we'll call him with his operative time and date.  I will follow the patient from a far, please page me for any immediate questions or concerns. Thank you for this is interesting consult.

## 2013-09-17 NOTE — Progress Notes (Signed)
Subjective: No complaints  Objective: Vital signs in last 24 hours: Temp:  [98.2 F (36.8 C)-99 F (37.2 C)] 98.3 F (36.8 C) (01/15 0520) Pulse Rate:  [75-82] 81 (01/15 0520) Resp:  [13-18] 18 (01/15 0520) BP: (120-147)/(61-71) 120/64 mmHg (01/15 0520) SpO2:  [94 %-100 %] 97 % (01/15 0520) Weight change:  Last BM Date: 09/16/13  Intake/Output from previous day: 01/14 0701 - 01/15 0700 In: 2161.7 [P.O.:200; I.V.:1961.7] Out: 450 [Urine:450] Intake/Output this shift:    General appearance: alert and cooperative Resp: clear to auscultation bilaterally Cardio: regular rate and rhythm, S1, S2 normal, no murmur, click, rub or gallop GI: soft, non-tender; bowel sounds normal; no masses,  no organomegaly Extremities: extremities normal, atraumatic, no cyanosis or edema  Lab Results:  Recent Labs  09/16/13 0525 09/17/13 0520  WBC 11.7* 7.2  HGB 10.2* 8.2*  HCT 29.6* 24.1*  PLT 88* 85*   BMET  Recent Labs  09/16/13 0525 09/17/13 0520  NA 137 141  K 3.7 3.6*  CL 102 107  CO2 20 18*  GLUCOSE 84 92  BUN 81* 68*  CREATININE 3.79* 2.99*  CALCIUM 8.9 7.9*    Studies/Results: Ct Abdomen Pelvis Wo Contrast  09/15/2013   CLINICAL DATA:  General body aches and. Emesis. Left-sided flank pain.  EXAM: CT ABDOMEN AND PELVIS WITHOUT CONTRAST  TECHNIQUE: Multidetector CT imaging of the abdomen and pelvis was performed following the standard protocol without intravenous contrast.  COMPARISON:  01/28/2007 CT abdomen pelvis.  FINDINGS: Basilar scarring/ subsegmental atelectasis.  Markedly enlarged prostate gland with asymmetric extension to the right. This may be invading the right bladder wall at the level of the right ureterovesical junction contributing to prominent right-sided hydroureteronephrosis. This highly suspicious for prostate carcinoma. Pelvic adenopathy greatest right operator region measuring 3.7 x 2 cm. Right retrocrural slightly prominent size lymph node. Para-aortic  and interaortic caval adenopathy. Multiple sclerotic foci involving the ileum bilaterally, sacrum, L3, L2 and T12 suspicious for sclerotic metastatic disease.  Tiny nonobstructing renal calculi.  Taking into account limitation by non contrast imaging, no worrisome of hepatic, splenic, pancreatic, renal or adrenal lesion. No calcified gallstones.  Scattered colonic diverticula without extra luminal bowel inflammatory process, free fluid or free air.  Ankylosis L2 through S1.  Degenerative changes above this region.  Atherosclerotic type changes aorta with marked ectasia without focal aneurysm. Atherosclerotic type changes with ectasia common iliac arteries. Atherosclerotic type changes aortic branch vessels with ectatic tortuous splenic artery.  IMPRESSION: Findings highly suspicious for prostate cancer with with invasion of the right bladder wall causing marked right-sided hydronephrosis. Metastatic disease suspected as indicated by adenopathy and the osseous sclerotic foci as noted above.  Atherosclerotic type changes aorta and common iliac arteries.  These results were called by telephone at the time of interpretation on 09/15/2013 at 11:48 AM to Dr. Murlean Caller , who verbally acknowledged these results.   Electronically Signed   By: Chauncey Cruel M.D.   On: 09/15/2013 11:46   Dg Chest 2 View  09/15/2013   CLINICAL DATA:  Elevated WBC, weakness, confusion  EXAM: CHEST  2 VIEW  COMPARISON:  01/23/2010  FINDINGS: Cardiomediastinal silhouette is stable. No acute infiltrate or pleural effusion. No pulmonary edema. Mild degenerative changes thoracic spine.  IMPRESSION: No active cardiopulmonary disease.   Electronically Signed   By: Lahoma Crocker M.D.   On: 09/15/2013 11:18   US Renal  09/15/2013   CLINICAL DATA:  Elevated creatinine  EXAM: RENAL/URINARY TRACT ULTRASOUND COMPLETE  COMPARISON:  CT from earlier in the same day.  FINDINGS: Right Kidney:  Length: 11.2 cm in length. Hydronephrosis with prominent ureter  is noted similar to that seen on recent CT.  Left Kidney:  Length: 12.2 cm. Echogenicity within normal limits. No mass or hydronephrosis visualized.  Bladder:  Bladder is well distended. Significant enlargement of the prostate (6.3 x 4.1 x 5.1 cm) is noted similar to that seen on recent CT examination. Bilateral ureteral jets are noted however.  IMPRESSION: Right-sided hydronephrosis and hydroureter consistent with that seen on recent CT examination.  Enlarged prostate similar to that seen on recent CT examination highly suggestive of prostate carcinoma with invasion into the bladder.   Electronically Signed   By: Inez Catalina M.D.   On: 09/15/2013 19:21   Ir Perc Nephrostomy Right  09/16/2013   CLINICAL DATA:  Right hydronephrosis secondary to right distal ureteral obstruction from invasive prostate cancer, elevated creatinine  EXAM: ULTRASOUND GUIDANCE FOR NEPHROSTOMY ACCESS  FLUOROSCOPIC AT 10 FRENCH RIGHT NEPHROSTOMY INSERTION  Date:  1/14/20151/14/2015 12:26 PM  Radiologist:  Jerilynn Mages. Daryll Brod, MD  Guidance:  Ultrasound fluoroscopic  MEDICATIONS AND MEDICAL HISTORY: 400 mg a Cipro administered within 1 hr of the procedure, 1 mg Versed, 25 mcg fentanyl  ANESTHESIA/SEDATION: 15 min  CONTRAST:  59mL OMNIPAQUE IOHEXOL 300 MG/ML  SOLN  FLUOROSCOPY TIME:  0.8 min  PROCEDURE: Informed consent was obtained from the patient following explanation of the procedure, risks, benefits and alternatives. The patient understands, agrees and consents for the procedure. All questions were addressed. A time out was performed.  Maximal barrier sterile technique utilized including caps, mask, sterile gowns, sterile gloves, large sterile drape, hand hygiene, and Betadine.  Previous imaging reviewed. Patient positioned prone. Preliminary ultrasound performed demonstrating hydronephrosis of the right kidney. Under sterile conditions and local anesthesia, ultrasound needle access was performed of the right lower pole calyx. There was  return of urine. Guidewire inserted followed by the Accustick dilator set. Guidewire exchange for an Amplatz guidewire. Tract dilatation performed to advance a 10 French nephrostomy. Retention loop formed in the renal pelvis. Contrast injection confirms position. Catheter secured with a Prolene suture and connected to external drainage. Sterile dressing applied. No immediate complication. Patient tolerated the procedure well.  COMPLICATIONS: No immediate  IMPRESSION: Successful ultrasound and fluoroscopic 10 French right nephrostomy   Electronically Signed   By: Daryll Brod M.D.   On: 09/16/2013 15:57   Ir Venipuncture 55yrs/older By Md  09/16/2013   CLINICAL DATA:  No peripheral access, obstructive hydronephrosis  EXAM: ULTRASOUND GUIDANCE FOR VASCULAR ACCESS  VENOUS PUNCTURE BY PHYSICIAN  Date:  1/14/20151/14/2015 12:26 PM  Radiologist:  Jerilynn Mages. Daryll Brod, MD  Guidance:  Ultrasound  MEDICATIONS AND MEDICAL HISTORY: None.  ANESTHESIA/SEDATION: None.  CONTRAST:  30mL OMNIPAQUE IOHEXOL 300 MG/ML  SOLN  FLUOROSCOPY TIME:  None.  PROCEDURE: Under sterile conditions, ultrasound Angiocath access was performed of the right upper extremity cephalic vein at the mid humerus level. Angio cath was advanced into the vein under direct ultrasound. Sterile dressing applied. No immediate complication.  COMPLICATIONS: No immediate  IMPRESSION: Successful ultrasound right upper extremity peripheral venous access   Electronically Signed   By: Daryll Brod M.D.   On: 09/16/2013 16:05   Ir US Guide Vasc Access Right  09/16/2013   CLINICAL DATA:  No peripheral access, obstructive hydronephrosis  EXAM: ULTRASOUND GUIDANCE FOR VASCULAR ACCESS  VENOUS PUNCTURE BY PHYSICIAN  Date:  1/14/20151/14/2015 12:26 PM  Radiologist:  Jerilynn Mages. Daryll Brod, MD  Guidance:  Ultrasound  MEDICATIONS AND MEDICAL HISTORY: None.  ANESTHESIA/SEDATION: None.  CONTRAST:  35mL OMNIPAQUE IOHEXOL 300 MG/ML  SOLN  FLUOROSCOPY TIME:  None.  PROCEDURE: Under sterile  conditions, ultrasound Angiocath access was performed of the right upper extremity cephalic vein at the mid humerus level. Angio cath was advanced into the vein under direct ultrasound. Sterile dressing applied. No immediate complication.  COMPLICATIONS: No immediate  IMPRESSION: Successful ultrasound right upper extremity peripheral venous access   Electronically Signed   By: Daryll Brod M.D.   On: 09/16/2013 16:05   Ir US Guide Bx Asp/drain  09/16/2013   CLINICAL DATA:  Right hydronephrosis secondary to right distal ureteral obstruction from invasive prostate cancer, elevated creatinine  EXAM: ULTRASOUND GUIDANCE FOR NEPHROSTOMY ACCESS  FLUOROSCOPIC AT 10 FRENCH RIGHT NEPHROSTOMY INSERTION  Date:  1/14/20151/14/2015 12:26 PM  Radiologist:  Jerilynn Mages. Daryll Brod, MD  Guidance:  Ultrasound fluoroscopic  MEDICATIONS AND MEDICAL HISTORY: 400 mg a Cipro administered within 1 hr of the procedure, 1 mg Versed, 25 mcg fentanyl  ANESTHESIA/SEDATION: 15 min  CONTRAST:  34mL OMNIPAQUE IOHEXOL 300 MG/ML  SOLN  FLUOROSCOPY TIME:  0.8 min  PROCEDURE: Informed consent was obtained from the patient following explanation of the procedure, risks, benefits and alternatives. The patient understands, agrees and consents for the procedure. All questions were addressed. A time out was performed.  Maximal barrier sterile technique utilized including caps, mask, sterile gowns, sterile gloves, large sterile drape, hand hygiene, and Betadine.  Previous imaging reviewed. Patient positioned prone. Preliminary ultrasound performed demonstrating hydronephrosis of the right kidney. Under sterile conditions and local anesthesia, ultrasound needle access was performed of the right lower pole calyx. There was return of urine. Guidewire inserted followed by the Accustick dilator set. Guidewire exchange for an Amplatz guidewire. Tract dilatation performed to advance a 10 French nephrostomy. Retention loop formed in the renal pelvis. Contrast injection  confirms position. Catheter secured with a Prolene suture and connected to external drainage. Sterile dressing applied. No immediate complication. Patient tolerated the procedure well.  COMPLICATIONS: No immediate  IMPRESSION: Successful ultrasound and fluoroscopic 10 French right nephrostomy   Electronically Signed   By: Daryll Brod M.D.   On: 09/16/2013 15:57    Medications: I have reviewed the patient's current medications.  Assessment/Plan: Active Problems:  Acute renal failure Some improvement in creatinine, nephrostomy tube in place, continue IVFs  Prostate cancer, metastatic, appreciate urology input, bone scan today, biopsy as outpatient   Right Hydronephrosis secondary to prostate cancer,  R percutaneous nephrostomy tube placed  Gross Hematuria, Acute Blood Loss Anemia, Hgb down 2 grams, follow CBC, check iron studies, hold anticoagulants  Disposition  PT to see today, hope to send home tomorrow   LOS: 2 days   Michael Estes JOSEPH 09/17/2013, 7:19 AM

## 2013-09-17 NOTE — Progress Notes (Signed)
Subjective: Pt feeling a little better this am; has some mild soreness at rt nephrostomy site  Objective: Vital signs in last 24 hours: Temp:  [98.2 F (36.8 C)-99 F (37.2 C)] 98.3 F (36.8 C) (01/15 0520) Pulse Rate:  [75-82] 81 (01/15 0520) Resp:  [13-18] 18 (01/15 0520) BP: (120-147)/(61-71) 120/64 mmHg (01/15 0520) SpO2:  [94 %-100 %] 97 % (01/15 0520) Last BM Date: 09/17/13  Intake/Output from previous day: 01/14 0701 - 01/15 0700 In: 2161.7 [P.O.:200; I.V.:1961.7] Out: 1050 [Urine:1050] Intake/Output this shift:    Right PCN intact, output 900 cc's bloody urine; insertion site ok, mildly tender, cx's pend  Lab Results:   Recent Labs  09/16/13 0525 09/17/13 0520  WBC 11.7* 7.2  HGB 10.2* 8.2*  HCT 29.6* 24.1*  PLT 88* 85*   BMET  Recent Labs  09/16/13 0525 09/17/13 0520  NA 137 141  K 3.7 3.6*  CL 102 107  CO2 20 18*  GLUCOSE 84 92  BUN 81* 68*  CREATININE 3.79* 2.99*  CALCIUM 8.9 7.9*   PT/INR  Recent Labs  09/16/13 0920  LABPROT 15.4*  INR 1.25   ABG No results found for this basename: PHART, PCO2, PO2, HCO3,  in the last 72 hours  Studies/Results: Ct Abdomen Pelvis Wo Contrast  09/15/2013   CLINICAL DATA:  General body aches and. Emesis. Left-sided flank pain.  EXAM: CT ABDOMEN AND PELVIS WITHOUT CONTRAST  TECHNIQUE: Multidetector CT imaging of the abdomen and pelvis was performed following the standard protocol without intravenous contrast.  COMPARISON:  01/28/2007 CT abdomen pelvis.  FINDINGS: Basilar scarring/ subsegmental atelectasis.  Markedly enlarged prostate gland with asymmetric extension to the right. This may be invading the right bladder wall at the level of the right ureterovesical junction contributing to prominent right-sided hydroureteronephrosis. This highly suspicious for prostate carcinoma. Pelvic adenopathy greatest right operator region measuring 3.7 x 2 cm. Right retrocrural slightly prominent size lymph node.  Para-aortic and interaortic caval adenopathy. Multiple sclerotic foci involving the ileum bilaterally, sacrum, L3, L2 and T12 suspicious for sclerotic metastatic disease.  Tiny nonobstructing renal calculi.  Taking into account limitation by non contrast imaging, no worrisome of hepatic, splenic, pancreatic, renal or adrenal lesion. No calcified gallstones.  Scattered colonic diverticula without extra luminal bowel inflammatory process, free fluid or free air.  Ankylosis L2 through S1.  Degenerative changes above this region.  Atherosclerotic type changes aorta with marked ectasia without focal aneurysm. Atherosclerotic type changes with ectasia common iliac arteries. Atherosclerotic type changes aortic branch vessels with ectatic tortuous splenic artery.  IMPRESSION: Findings highly suspicious for prostate cancer with with invasion of the right bladder wall causing marked right-sided hydronephrosis. Metastatic disease suspected as indicated by adenopathy and the osseous sclerotic foci as noted above.  Atherosclerotic type changes aorta and common iliac arteries.  These results were called by telephone at the time of interpretation on 09/15/2013 at 11:48 AM to Dr. Murlean Caller , who verbally acknowledged these results.   Electronically Signed   By: Chauncey Cruel M.D.   On: 09/15/2013 11:46   Dg Chest 2 View  09/15/2013   CLINICAL DATA:  Elevated WBC, weakness, confusion  EXAM: CHEST  2 VIEW  COMPARISON:  01/23/2010  FINDINGS: Cardiomediastinal silhouette is stable. No acute infiltrate or pleural effusion. No pulmonary edema. Mild degenerative changes thoracic spine.  IMPRESSION: No active cardiopulmonary disease.   Electronically Signed   By: Lahoma Crocker M.D.   On: 09/15/2013 11:18   US Renal  09/15/2013  CLINICAL DATA:  Elevated creatinine  EXAM: RENAL/URINARY TRACT ULTRASOUND COMPLETE  COMPARISON:  CT from earlier in the same day.  FINDINGS: Right Kidney:  Length: 11.2 cm in length. Hydronephrosis with  prominent ureter is noted similar to that seen on recent CT.  Left Kidney:  Length: 12.2 cm. Echogenicity within normal limits. No mass or hydronephrosis visualized.  Bladder:  Bladder is well distended. Significant enlargement of the prostate (6.3 x 4.1 x 5.1 cm) is noted similar to that seen on recent CT examination. Bilateral ureteral jets are noted however.  IMPRESSION: Right-sided hydronephrosis and hydroureter consistent with that seen on recent CT examination.  Enlarged prostate similar to that seen on recent CT examination highly suggestive of prostate carcinoma with invasion into the bladder.   Electronically Signed   By: Alcide Clever M.D.   On: 09/15/2013 19:21   Ir Perc Nephrostomy Right  09/16/2013   CLINICAL DATA:  Right hydronephrosis secondary to right distal ureteral obstruction from invasive prostate cancer, elevated creatinine  EXAM: ULTRASOUND GUIDANCE FOR NEPHROSTOMY ACCESS  FLUOROSCOPIC AT 10 FRENCH RIGHT NEPHROSTOMY INSERTION  Date:  1/14/20151/14/2015 12:26 PM  Radiologist:  Judie Petit. Ruel Favors, MD  Guidance:  Ultrasound fluoroscopic  MEDICATIONS AND MEDICAL HISTORY: 400 mg a Cipro administered within 1 hr of the procedure, 1 mg Versed, 25 mcg fentanyl  ANESTHESIA/SEDATION: 15 min  CONTRAST:  20mL OMNIPAQUE IOHEXOL 300 MG/ML  SOLN  FLUOROSCOPY TIME:  0.8 min  PROCEDURE: Informed consent was obtained from the patient following explanation of the procedure, risks, benefits and alternatives. The patient understands, agrees and consents for the procedure. All questions were addressed. A time out was performed.  Maximal barrier sterile technique utilized including caps, mask, sterile gowns, sterile gloves, large sterile drape, hand hygiene, and Betadine.  Previous imaging reviewed. Patient positioned prone. Preliminary ultrasound performed demonstrating hydronephrosis of the right kidney. Under sterile conditions and local anesthesia, ultrasound needle access was performed of the right lower pole  calyx. There was return of urine. Guidewire inserted followed by the Accustick dilator set. Guidewire exchange for an Amplatz guidewire. Tract dilatation performed to advance a 10 French nephrostomy. Retention loop formed in the renal pelvis. Contrast injection confirms position. Catheter secured with a Prolene suture and connected to external drainage. Sterile dressing applied. No immediate complication. Patient tolerated the procedure well.  COMPLICATIONS: No immediate  IMPRESSION: Successful ultrasound and fluoroscopic 10 French right nephrostomy   Electronically Signed   By: Ruel Favors M.D.   On: 09/16/2013 15:57   Ir Venipuncture 71yrs/older By Md  09/16/2013   CLINICAL DATA:  No peripheral access, obstructive hydronephrosis  EXAM: ULTRASOUND GUIDANCE FOR VASCULAR ACCESS  VENOUS PUNCTURE BY PHYSICIAN  Date:  1/14/20151/14/2015 12:26 PM  Radiologist:  Judie Petit. Ruel Favors, MD  Guidance:  Ultrasound  MEDICATIONS AND MEDICAL HISTORY: None.  ANESTHESIA/SEDATION: None.  CONTRAST:  13mL OMNIPAQUE IOHEXOL 300 MG/ML  SOLN  FLUOROSCOPY TIME:  None.  PROCEDURE: Under sterile conditions, ultrasound Angiocath access was performed of the right upper extremity cephalic vein at the mid humerus level. Angio cath was advanced into the vein under direct ultrasound. Sterile dressing applied. No immediate complication.  COMPLICATIONS: No immediate  IMPRESSION: Successful ultrasound right upper extremity peripheral venous access   Electronically Signed   By: Ruel Favors M.D.   On: 09/16/2013 16:05   Ir US Guide Vasc Access Right  09/16/2013   CLINICAL DATA:  No peripheral access, obstructive hydronephrosis  EXAM: ULTRASOUND GUIDANCE FOR VASCULAR ACCESS  VENOUS PUNCTURE BY  PHYSICIAN  Date:  1/14/20151/14/2015 12:26 PM  Radiologist:  Jerilynn Mages. Daryll Brod, MD  Guidance:  Ultrasound  MEDICATIONS AND MEDICAL HISTORY: None.  ANESTHESIA/SEDATION: None.  CONTRAST:  43mL OMNIPAQUE IOHEXOL 300 MG/ML  SOLN  FLUOROSCOPY TIME:  None.   PROCEDURE: Under sterile conditions, ultrasound Angiocath access was performed of the right upper extremity cephalic vein at the mid humerus level. Angio cath was advanced into the vein under direct ultrasound. Sterile dressing applied. No immediate complication.  COMPLICATIONS: No immediate  IMPRESSION: Successful ultrasound right upper extremity peripheral venous access   Electronically Signed   By: Daryll Brod M.D.   On: 09/16/2013 16:05   Ir US Guide Bx Asp/drain  09/16/2013   CLINICAL DATA:  Right hydronephrosis secondary to right distal ureteral obstruction from invasive prostate cancer, elevated creatinine  EXAM: ULTRASOUND GUIDANCE FOR NEPHROSTOMY ACCESS  FLUOROSCOPIC AT 10 FRENCH RIGHT NEPHROSTOMY INSERTION  Date:  1/14/20151/14/2015 12:26 PM  Radiologist:  Jerilynn Mages. Daryll Brod, MD  Guidance:  Ultrasound fluoroscopic  MEDICATIONS AND MEDICAL HISTORY: 400 mg a Cipro administered within 1 hr of the procedure, 1 mg Versed, 25 mcg fentanyl  ANESTHESIA/SEDATION: 15 min  CONTRAST:  64mL OMNIPAQUE IOHEXOL 300 MG/ML  SOLN  FLUOROSCOPY TIME:  0.8 min  PROCEDURE: Informed consent was obtained from the patient following explanation of the procedure, risks, benefits and alternatives. The patient understands, agrees and consents for the procedure. All questions were addressed. A time out was performed.  Maximal barrier sterile technique utilized including caps, mask, sterile gowns, sterile gloves, large sterile drape, hand hygiene, and Betadine.  Previous imaging reviewed. Patient positioned prone. Preliminary ultrasound performed demonstrating hydronephrosis of the right kidney. Under sterile conditions and local anesthesia, ultrasound needle access was performed of the right lower pole calyx. There was return of urine. Guidewire inserted followed by the Accustick dilator set. Guidewire exchange for an Amplatz guidewire. Tract dilatation performed to advance a 10 French nephrostomy. Retention loop formed in the renal  pelvis. Contrast injection confirms position. Catheter secured with a Prolene suture and connected to external drainage. Sterile dressing applied. No immediate complication. Patient tolerated the procedure well.  COMPLICATIONS: No immediate  IMPRESSION: Successful ultrasound and fluoroscopic 10 French right nephrostomy   Electronically Signed   By: Daryll Brod M.D.   On: 09/16/2013 15:57    Anti-infectives: Anti-infectives   Start     Dose/Rate Route Frequency Ordered Stop   09/16/13 1000  azithromycin (ZITHROMAX) tablet 250 mg  Status:  Discontinued     250 mg Oral Daily 09/15/13 1722 09/17/13 0723   09/16/13 0845  ciprofloxacin (CIPRO) IVPB 400 mg    Comments:  Hang ON CALL to Radiology 1/14   400 mg 200 mL/hr over 60 Minutes Intravenous  Once 09/16/13 0839 09/16/13 1226      Assessment/Plan: s/p right PCN 1/14; cont current tx; check cx/cytology on PCN urine; hydrate, monitor labs, other plans as per urology/IM  LOS: 2 days    Jupiter Kabir,D [facility] 09/17/2013

## 2013-09-18 ENCOUNTER — Encounter (HOSPITAL_BASED_OUTPATIENT_CLINIC_OR_DEPARTMENT_OTHER): Payer: Self-pay | Admitting: *Deleted

## 2013-09-18 LAB — CBC
HCT: 23.9 % — ABNORMAL LOW (ref 39.0–52.0)
Hemoglobin: 8.4 g/dL — ABNORMAL LOW (ref 13.0–17.0)
MCH: 32.7 pg (ref 26.0–34.0)
MCHC: 35.1 g/dL (ref 30.0–36.0)
MCV: 93 fL (ref 78.0–100.0)
Platelets: 110 10*3/uL — ABNORMAL LOW (ref 150–400)
RBC: 2.57 MIL/uL — ABNORMAL LOW (ref 4.22–5.81)
RDW: 14.4 % (ref 11.5–15.5)
WBC: 6.4 10*3/uL (ref 4.0–10.5)

## 2013-09-18 LAB — BASIC METABOLIC PANEL
BUN: 49 mg/dL — ABNORMAL HIGH (ref 6–23)
CHLORIDE: 109 meq/L (ref 96–112)
CO2: 18 mEq/L — ABNORMAL LOW (ref 19–32)
Calcium: 7.7 mg/dL — ABNORMAL LOW (ref 8.4–10.5)
Creatinine, Ser: 2.44 mg/dL — ABNORMAL HIGH (ref 0.50–1.35)
GFR, EST AFRICAN AMERICAN: 27 mL/min — AB (ref >90)
GFR, EST NON AFRICAN AMERICAN: 24 mL/min — AB (ref >90)
Glucose, Bld: 103 mg/dL — ABNORMAL HIGH (ref 70–99)
Potassium: 3.6 mEq/L — ABNORMAL LOW (ref 3.7–5.3)
SODIUM: 142 meq/L (ref 137–147)

## 2013-09-18 LAB — TESTOSTERONE: Testosterone: 173 ng/dL — ABNORMAL LOW (ref 300–890)

## 2013-09-18 NOTE — Discharge Summary (Signed)
Physician Discharge Summary  Patient ID: Michael Estes MRN: [id number] DOB/AGE: [birth year] 80 y.o.  Admit date: 09/15/2013 Discharge date: 09/18/2013  Admission Diagnoses: Acute renal failure Leukocytosis Right hydronephrosis   Discharge Diagnoses:  Active Problems:   Acute renal failure Metastatic prostate cancer   Leukocytosis, unspecified, resolved   Right Hydronephrosis   Discharged Condition: good  Hospital Course: The patient was admitted on January 14 of nausea vomiting generalized weakness and some left pelvic and flank pain. A CT scan of the abdomen and pelvis done in emergency department showed findings suspicious for prostate cancer with invasion of the right bladder wall and marked right-sided hydronephrosis, metastatic disease indicated by adenopathy and osseous sclerotic foci. Chest x-ray showed no acute disease. Renal ultrasound showed right-sided hydronephrosis and a large prostate highly suggestive of invasive prostate cancer. PSA was 4524. The patient was seen by urology. The impression was probable aggressive metastatic prostate cancer with right-sided hydronephrosis from local extension of prostate cancer into the bladder obstructing the right distal ureter. Right-sided percutaneous nephrostomy tube and bones and were recommended. The patient did have placement of a right-sided percutaneous nephrostomy tube on January 14 without complication. Bone scan was done on January 15 and showed evidence of diffuse bony metastases throughout the skull, left clavicle, manubrium, ribs, spine and pelvis. The patient did drop his hemoglobin from 11 down to 8.4 at discharge. He did have some gross hematuria after placement of nephrostomy tube. Iron studies were consistent with chronic disease. The patient was seen in followup by Dr. Louis Meckel of urology and plans for prostate biopsy with orchiectomy were made for next week. The patient was seen by physical therapy and home physical therapy  was recommended. With placement of nephrostomy tube and IV fluids the patient's creatinine significantly improved from over 4-2.44 at discharge.  Consults: urology  Significant Diagnostic Studies: labs: Sodium 142, potassium 3.6, chloride 100 her carbonate 18, BUN 49, creatinine 2.44,, WC 6.4, hemoglobin 8.4, platelet 110 radiology: CXR: normal, CT scan: As above and Ultrasound: As above and nuclear medicine:  Bone scan as above  Treatments: IV hydration and therapies: PT  Discharge Exam: Blood pressure 132/63, pulse 87, temperature 99.9 F (37.7 C), temperature source Oral, resp. rate 16, height 6' (1.829 m), weight 81.375 kg (179 lb 6.4 oz), SpO2 96.00%. General appearance: alert Lungs: Clear Cardio: regular rate and rhythm, S1, S2 normal, no murmur, click, rub or gallop  Disposition:  home      Medication List    STOP taking these medications       azithromycin 250 MG tablet  Commonly known as:  ZITHROMAX      TAKE these medications       allopurinol 300 MG tablet  Commonly known as:  ZYLOPRIM  Take 300 mg by mouth daily.     cetirizine 10 MG tablet  Commonly known as:  ZYRTEC  Take 10 mg by mouth daily.     citalopram 20 MG tablet  Commonly known as:  CELEXA  Take 20 mg by mouth daily.     promethazine 25 MG tablet  Commonly known as:  PHENERGAN  Take 25-50 mg by mouth every 6 (six) hours as needed for nausea or vomiting.     VITAMIN D PO  Take 1 tablet by mouth daily.           Follow-up Information   Follow up with Ardis Hughs, MD In 7 weeks.   Specialty:  Urology   Contact information:  [facility]  [place] [address] [phone]       Follow up with Irven Shelling, MD In 2 weeks.   Specialty:  Internal Medicine   Contact information:   [address] [place] [address] [phone]       Signed: Irven Shelling 09/18/2013, 7:41 AM

## 2013-09-21 ENCOUNTER — Other Ambulatory Visit: Payer: Self-pay | Admitting: Urology

## 2013-09-23 ENCOUNTER — Ambulatory Visit (HOSPITAL_BASED_OUTPATIENT_CLINIC_OR_DEPARTMENT_OTHER): Admission: RE | Admit: 2013-09-23 | Payer: Medicare Other | Source: Ambulatory Visit | Admitting: Urology

## 2013-09-23 ENCOUNTER — Encounter (HOSPITAL_BASED_OUTPATIENT_CLINIC_OR_DEPARTMENT_OTHER): Admission: RE | Payer: Self-pay | Source: Ambulatory Visit

## 2013-09-23 HISTORY — DX: Elevated prostate specific antigen (PSA): R97.20

## 2013-09-23 HISTORY — DX: Personal history of other diseases of the musculoskeletal system and connective tissue: Z87.39

## 2013-09-23 HISTORY — DX: Other specified disorders of prostate: N42.89

## 2013-09-23 HISTORY — DX: Gross hematuria: R31.0

## 2013-09-23 HISTORY — DX: Weakness: R53.1

## 2013-09-23 SURGERY — BIOPSY, PROSTATE
Anesthesia: General

## 2013-10-17 ENCOUNTER — Encounter (HOSPITAL_COMMUNITY): Payer: Self-pay | Admitting: Emergency Medicine

## 2013-10-17 ENCOUNTER — Emergency Department (HOSPITAL_COMMUNITY): Payer: Medicare Other

## 2013-10-17 ENCOUNTER — Inpatient Hospital Stay (HOSPITAL_COMMUNITY)
Admission: EM | Admit: 2013-10-17 | Discharge: 2013-10-20 | DRG: 872 | Disposition: A | Discharge status: Home / Self Care | Payer: Medicare Other | Attending: Internal Medicine | Admitting: Internal Medicine

## 2013-10-17 DIAGNOSIS — Z79899 Other long term (current) drug therapy: Secondary | ICD-10-CM

## 2013-10-17 DIAGNOSIS — C7952 Secondary malignant neoplasm of bone marrow: Secondary | ICD-10-CM

## 2013-10-17 DIAGNOSIS — N390 Urinary tract infection, site not specified: Secondary | ICD-10-CM | POA: Diagnosis present

## 2013-10-17 DIAGNOSIS — Z515 Encounter for palliative care: Secondary | ICD-10-CM

## 2013-10-17 DIAGNOSIS — R29810 Facial weakness: Secondary | ICD-10-CM | POA: Diagnosis present

## 2013-10-17 DIAGNOSIS — C7951 Secondary malignant neoplasm of bone: Secondary | ICD-10-CM | POA: Diagnosis present

## 2013-10-17 DIAGNOSIS — A419 Sepsis, unspecified organism: Principal | ICD-10-CM | POA: Diagnosis present

## 2013-10-17 DIAGNOSIS — Z66 Do not resuscitate: Secondary | ICD-10-CM

## 2013-10-17 DIAGNOSIS — D649 Anemia, unspecified: Secondary | ICD-10-CM | POA: Diagnosis present

## 2013-10-17 DIAGNOSIS — B952 Enterococcus as the cause of diseases classified elsewhere: Secondary | ICD-10-CM | POA: Diagnosis present

## 2013-10-17 DIAGNOSIS — C61 Malignant neoplasm of prostate: Secondary | ICD-10-CM | POA: Diagnosis present

## 2013-10-17 DIAGNOSIS — M109 Gout, unspecified: Secondary | ICD-10-CM | POA: Diagnosis present

## 2013-10-17 DIAGNOSIS — H9190 Unspecified hearing loss, unspecified ear: Secondary | ICD-10-CM | POA: Diagnosis present

## 2013-10-17 HISTORY — DX: Malignant (primary) neoplasm, unspecified: C80.1

## 2013-10-17 LAB — CBC WITH DIFFERENTIAL/PLATELET
Basophils Absolute: 0 10*3/uL (ref 0.0–0.1)
Basophils Relative: 0 % (ref 0–1)
EOS ABS: 0 10*3/uL (ref 0.0–0.7)
EOS PCT: 0 % (ref 0–5)
HCT: 29.1 % — ABNORMAL LOW (ref 39.0–52.0)
Hemoglobin: 9.8 g/dL — ABNORMAL LOW (ref 13.0–17.0)
LYMPHS ABS: 1.9 10*3/uL (ref 0.7–4.0)
Lymphocytes Relative: 15 % (ref 12–46)
MCH: 32.9 pg (ref 26.0–34.0)
MCHC: 33.7 g/dL (ref 30.0–36.0)
MCV: 97.7 fL (ref 78.0–100.0)
Monocytes Absolute: 1.5 10*3/uL — ABNORMAL HIGH (ref 0.1–1.0)
Monocytes Relative: 12 % (ref 3–12)
Neutro Abs: 9.1 10*3/uL — ABNORMAL HIGH (ref 1.7–7.7)
Neutrophils Relative %: 73 % (ref 43–77)
Platelets: 92 10*3/uL — ABNORMAL LOW (ref 150–400)
RBC: 2.98 MIL/uL — AB (ref 4.22–5.81)
RDW: 15.4 % (ref 11.5–15.5)
WBC: 12.4 10*3/uL — ABNORMAL HIGH (ref 4.0–10.5)

## 2013-10-17 LAB — COMPREHENSIVE METABOLIC PANEL
ALBUMIN: 2.8 g/dL — AB (ref 3.5–5.2)
ALT: 8 U/L (ref 0–53)
AST: 26 U/L (ref 0–37)
Alkaline Phosphatase: 159 U/L — ABNORMAL HIGH (ref 39–117)
BUN: 17 mg/dL (ref 6–23)
CALCIUM: 8.3 mg/dL — AB (ref 8.4–10.5)
CO2: 20 mEq/L (ref 19–32)
Chloride: 103 mEq/L (ref 96–112)
Creatinine, Ser: 1.19 mg/dL (ref 0.50–1.35)
GFR calc non Af Amer: 56 mL/min — ABNORMAL LOW (ref >90)
GFR, EST AFRICAN AMERICAN: 65 mL/min — AB (ref >90)
GLUCOSE: 138 mg/dL — AB (ref 70–99)
Potassium: 3.9 mEq/L (ref 3.7–5.3)
Sodium: 137 mEq/L (ref 137–147)
TOTAL PROTEIN: 6.1 g/dL (ref 6.0–8.3)
Total Bilirubin: 2 mg/dL — ABNORMAL HIGH (ref 0.3–1.2)

## 2013-10-17 LAB — URINE MICROSCOPIC-ADD ON

## 2013-10-17 LAB — URINALYSIS, ROUTINE W REFLEX MICROSCOPIC
BILIRUBIN URINE: NEGATIVE
Glucose, UA: NEGATIVE mg/dL
Hgb urine dipstick: NEGATIVE
Ketones, ur: NEGATIVE mg/dL
Nitrite: NEGATIVE
PH: 5.5 (ref 5.0–8.0)
Protein, ur: NEGATIVE mg/dL
SPECIFIC GRAVITY, URINE: 1.015 (ref 1.005–1.030)
Urobilinogen, UA: 0.2 mg/dL (ref 0.0–1.0)

## 2013-10-17 LAB — CG4 I-STAT (LACTIC ACID): Lactic Acid, Venous: 1.23 mmol/L (ref 0.5–2.2)

## 2013-10-17 LAB — LIPASE, BLOOD: Lipase: 27 U/L (ref 11–59)

## 2013-10-17 LAB — PROTIME-INR
INR: 1.29 (ref 0.00–1.49)
Prothrombin Time: 15.8 seconds — ABNORMAL HIGH (ref 11.6–15.2)

## 2013-10-17 LAB — PROCALCITONIN: Procalcitonin: 0.14 ng/mL

## 2013-10-17 MED ORDER — VANCOMYCIN HCL 500 MG IV SOLR
500.0000 mg | Freq: Once | INTRAVENOUS | Status: DC
  Filled 2013-10-17: qty 500

## 2013-10-17 MED ORDER — PIPERACILLIN-TAZOBACTAM 3.375 G IVPB 30 MIN
3.3750 g | Freq: Once | INTRAVENOUS | Status: AC
  Administered 2013-10-17: 3.375 g via INTRAVENOUS
  Filled 2013-10-17: qty 50

## 2013-10-17 MED ORDER — VANCOMYCIN HCL IN DEXTROSE 750-5 MG/150ML-% IV SOLN
750.0000 mg | Freq: Two times a day (BID) | INTRAVENOUS | Status: DC
  Filled 2013-10-17: qty 150

## 2013-10-17 MED ORDER — FENTANYL CITRATE 0.05 MG/ML IJ SOLN
25.0000 ug | INTRAMUSCULAR | Status: DC | PRN
  Administered 2013-10-17: 25 ug via INTRAVENOUS
  Filled 2013-10-17: qty 2

## 2013-10-17 MED ORDER — SODIUM CHLORIDE 0.9 % IV SOLN
1000.0000 mL | INTRAVENOUS | Status: DC
  Administered 2013-10-17: 1000 mL via INTRAVENOUS

## 2013-10-17 MED ORDER — PIPERACILLIN-TAZOBACTAM 3.375 G IVPB
3.3750 g | Freq: Three times a day (TID) | INTRAVENOUS | Status: DC
  Filled 2013-10-17 (×2): qty 50

## 2013-10-17 MED ORDER — VANCOMYCIN HCL IN DEXTROSE 1-5 GM/200ML-% IV SOLN
1000.0000 mg | Freq: Once | INTRAVENOUS | Status: AC
  Administered 2013-10-17: 1000 mg via INTRAVENOUS
  Filled 2013-10-17: qty 200

## 2013-10-17 MED ORDER — SODIUM CHLORIDE 0.9 % IV SOLN
1000.0000 mL | Freq: Once | INTRAVENOUS | Status: AC
  Administered 2013-10-17: 1000 mL via INTRAVENOUS

## 2013-10-17 MED ORDER — IBUPROFEN 800 MG PO TABS
800.0000 mg | ORAL_TABLET | Freq: Once | ORAL | Status: AC
  Administered 2013-10-17: 800 mg via ORAL
  Filled 2013-10-17: qty 1

## 2013-10-17 NOTE — ED Notes (Signed)
Mrs. Riegler home number [phone]

## 2013-10-17 NOTE — ED Notes (Signed)
Patient shivering and complaining of being cold

## 2013-10-17 NOTE — ED Provider Notes (Addendum)
CSN: [id number]     Arrival date & time 10/17/13  1515 History   First MD Initiated Contact with Patient 10/17/13 1522     Chief Complaint  Patient presents with  . Fever     HPI  Patient presents with generalized weakness, fever, pain in head and foot. History of present illness as according to the patient and his wife. It seems as though over the past day patient has had progressive weakness, developed fever, maximum temperature 105. Patient seems to always have some degree of headache, foot pain, this seems unchanged. Patient denies no disorientation or confusion, states that just progressively poor. No other focal pain, no dyspnea, no vomiting, no diarrhea, no incontinence. Patient did have a fall yesterday, though this was a secondary to weakness, no near syncope or syncope. Patient has a notable history of metastatic prostate cancer, is not currently getting to therapy anymore secondary to progression of disease. Patient also has a right sided nephrostomy bag in place.   Past Medical History  Diagnosis Date  . Seasonal allergies   . Anxiety   . Enlarged prostate   . HOH (hard of hearing)     BILATERAL  . History of gout   . Elevated PSA   . Prostate mass   . Arthritis   . Generalized weakness   . Gross hematuria    Past Surgical History  Procedure Laterality Date  . Plantar's wart excision Bilateral   . Cataract extraction w/ intraocular lens  implant, bilateral    . Tonsillectomy  AS CHILD  . Repair subluxing tendon with gastroc slide  01-25-2010    RIGHT FOOT   No family history on file. History  Substance Use Topics  . Smoking status: Never Smoker   . Smokeless tobacco: Never Used  . Alcohol Use: 12.6 oz/week    21 Shots of liquor per week     Comment: 09/15/2013 "3-4 mixed drinks/day; not so much since the end of 2014"    Review of Systems  Constitutional:       Per HPI, otherwise negative  HENT:       Per HPI, otherwise negative  Respiratory:        Per HPI, otherwise negative  Cardiovascular:       Per HPI, otherwise negative  Gastrointestinal: Negative for vomiting.  Endocrine:       Negative aside from HPI  Genitourinary:       Neg aside from HPI   Musculoskeletal:       Per HPI, otherwise negative  Skin: Negative.   Neurological: Positive for weakness and headaches. Negative for syncope.      Allergies  Review of patient's allergies indicates no known allergies.  Home Medications   Current Outpatient Rx  Name  Route  Sig  Dispense  Refill  . allopurinol (ZYLOPRIM) 300 MG tablet   Oral   Take 300 mg by mouth daily.         . cetirizine (ZYRTEC) 10 MG tablet   Oral   Take 10 mg by mouth daily.         . Cholecalciferol (VITAMIN D PO)   Oral   Take 1 tablet by mouth daily.         . citalopram (CELEXA) 20 MG tablet   Oral   Take 20 mg by mouth daily.         . promethazine (PHENERGAN) 25 MG tablet   Oral   Take 25-50 mg by mouth every  6 (six) hours as needed for nausea or vomiting.          BP 141/57  Temp(Src) 104.4 F (40.2 C) (Rectal)  Resp 22  SpO2 93% Physical Exam  Nursing note and vitals reviewed. Constitutional: He is oriented to person, place, and time. He appears well-developed. He appears ill.  HENT:  Head: Normocephalic and atraumatic.  Eyes: Conjunctivae and EOM are normal.  Cardiovascular: Regular rhythm.  Tachycardia present.   Pulmonary/Chest: No stridor. Tachypnea noted. No respiratory distress.  Abdominal: He exhibits no distension. There is no tenderness. There is no rigidity and no guarding.    Musculoskeletal: He exhibits no edema.  Neurological: He is alert and oriented to person, place, and time. He displays no atrophy and no tremor. No cranial nerve deficit or sensory deficit. He exhibits normal muscle tone. He displays no seizure activity.  Poor hearing, otherwise unremarkable neurologic exam  Skin: Skin is warm. He is diaphoretic.  Psychiatric: He has a normal  mood and affect.    ED Course  Procedures (including critical care time) Labs Review Labs Reviewed  CBC WITH DIFFERENTIAL - Abnormal; Notable for the following:    WBC 12.4 (*)    RBC 2.98 (*)    Hemoglobin 9.8 (*)    HCT 29.1 (*)    Platelets 92 (*)    Neutro Abs 9.1 (*)    Monocytes Absolute 1.5 (*)    All other components within normal limits  COMPREHENSIVE METABOLIC PANEL - Abnormal; Notable for the following:    Glucose, Bld 138 (*)    Calcium 8.3 (*)    Albumin 2.8 (*)    Alkaline Phosphatase 159 (*)    Total Bilirubin 2.0 (*)    GFR calc non Af Amer 56 (*)    GFR calc Af Amer 65 (*)    All other components within normal limits  URINALYSIS, ROUTINE W REFLEX MICROSCOPIC - Abnormal; Notable for the following:    Leukocytes, UA SMALL (*)    All other components within normal limits  PROTIME-INR - Abnormal; Notable for the following:    Prothrombin Time 15.8 (*)    All other components within normal limits  URINE MICROSCOPIC-ADD ON - Abnormal; Notable for the following:    Bacteria, UA MANY (*)    All other components within normal limits  URINE CULTURE  LIPASE, BLOOD  PROCALCITONIN  CG4 I-STAT (LACTIC ACID)   Imaging Review Dg Chest Port 1 View  10/17/2013   CLINICAL DATA:  Fever and cough.  EXAM: PORTABLE CHEST - 1 VIEW  COMPARISON:  09/15/2013  FINDINGS: Lordotic technique is demonstrated. Lungs are adequately inflated. There is mild increased density over the medial right apex which may be due to overlapping bony and soft tissue structures although cannot exclude a pulmonary parenchymal process. Cardiomediastinal silhouette and remainder of the exam is unchanged.  IMPRESSION: Mild increased density in the medial right apex which may be related to overlapping bony and vascular structures with lordotic technique although cannot exclude an underlying pulmonary parenchymal process. Consider upright PA and lateral chest x-ray for better evaluation when able.   Electronically  Signed   By: Elberta Fortis M.D.   On: 10/17/2013 16:30      Immediately after the initial evaluation, with the patient's tachycardia, fever, intra-antibiotics, fluids were started.  I discussed care with patient and his wife.  The patient is DO NOT RESUSCITATE. Regardless the patient is interested in intervention short of intubation or CPR.   7:09  PM Patient's fever has subsided.  Labs reviewed with him and his family members.   No new complaints, the patient has mild chills. Patient's family members request hospice consultation.  This will be accomplished as an inpatient. MDM   Patient with multiple medical problems, including metastatic prostate cancer now presents with concerns of fatigue, weakness. On initial exam the patient is tachycardic, tachypneic, febrile.  Patient's evaluation does not demonstrate pneumonia, or other overt evidence of focal infection, though the urinalysis suggests a UTI (possible urosepsis)   Notably, the patient has no ongoing therapy for a cancer, and after conversations with family members, the patient will have a hospice consult as an inpatient.   8:51 PM Patient has recurrence of fever w rigor.  Ibuprofen provided.   Patient continues to receive IV fluids, now totaling 2 L bolus, 125 ml/hour continuous infusion. Patient has already received broad-spectrum antibiotics, Zosyn, vancomycin. With fever, tachycardia, tachypnea, fatigue, sepsis, though the patient is not receiving additional chemotherapy for his cancer, and will not have CPR or intubation, he remained critically low, will be admitted for further evaluation and management.  CRITICAL CARE Performed by: Carmin Muskrat Total critical care time: 45 Critical care time was exclusive of separately billable procedures and treating other patients. Critical care was necessary to treat or prevent imminent or life-threatening deterioration. Critical care was time spent personally by me on the  following activities: development of treatment plan with patient and/or surrogate as well as nursing, discussions with consultants, evaluation of patient's response to treatment, examination of patient, obtaining history from patient or surrogate, ordering and performing treatments and interventions, ordering and review of laboratory studies, ordering and review of radiographic studies, pulse oximetry and re-evaluation of patient's condition.   Carmin Muskrat, MD 10/17/13 2112

## 2013-10-17 NOTE — ED Notes (Signed)
Called wife, informed of his room number. She will come in the morning to see him.

## 2013-10-17 NOTE — H&P (Signed)
PCP:  Irven Shelling, MD    Chief Complaint:   Fever, hiccups and pain in his feet  HPI: Michael Estes is a 80 y.o. male   has a past medical history of Seasonal allergies; Anxiety; Enlarged prostate; HOH (hard of hearing); History of gout; Elevated PSA; Prostate mass; Arthritis; Generalized weakness; Gross hematuria; and Cancer.   Presented with  Pateitn was diagnosed with metastatic cancer likley due to prostate cancer 09/08/13. Patient had involment of his bladder with hydronephrosis sp percutaneous nephrostomy tube on January 14.  Patient had bone scan done that showed diffuse bony metastases throughout the skull, left clavicle, manubrium, ribs, spine and pelvis. PSA was 4524.  Patient was supposed to Follow up with Urology Dr. Dr. Louis Meckel and after discussion patient has refused further treatment including prostate biopsy with orchiectomy. Wife states over the past week he took a sudden turn for the worse. He became progressively weak started to have urinary incontinence. And last night he tried to get up but could not stand.  His wife struggled to keep him up right. Since this AM he developed fever. Patient have had mild cough.  He developed fever and chills this AM and presented to ER where he was found to be slightly hypotensive, febrile up to 104.  Spoke at length with the family the patient's wife and patient himself. He'll be strongly feel that at this point they do not wish any aggressive interventions. They wish the patient to BE DO NOT RESUSCITATE DO NOT INTUBATE. Concentrate on comfort care only. Patient was okay with administration of IV fluids as needed and IV antibiotics but did not wish any aggressive interventions did not wish a line placement, intubation, life Supportive measures.  Review of Systems:    Pertinent positives include: Fevers, chills, feet hurting, urinary incontinence fatigue, Constitutional:  No weight loss, night sweats,   weight loss  HEENT:  No  headaches, Difficulty swallowing,Tooth/dental problems,Sore throat,  No sneezing, itching, ear ache, nasal congestion, post nasal drip,  Cardio-vascular:  No chest pain, Orthopnea, PND, anasarca, dizziness, palpitations.no Bilateral lower extremity swelling  GI:  No heartburn, indigestion, abdominal pain, nausea, vomiting, diarrhea, change in bowel habits, loss of appetite, melena, blood in stool, hematemesis Resp:  no shortness of breath at rest. No dyspnea on exertion, No excess mucus, no productive cough, No non-productive cough, No coughing up of blood.No change in color of mucus.No wheezing. Skin:  no rash or lesions. No jaundice GU:  no dysuria, change in color of urine, no urgency or frequency. No straining to urinate.  No flank pain.  Musculoskeletal:  No joint pain or no joint swelling. No decreased range of motion. No back pain.  Psych:  No change in mood or affect. No depression or anxiety. No memory loss.  Neuro: no localizing neurological complaints, no tingling, no weakness, no double vision, no gait abnormality, no slurred speech, no confusion  Otherwise ROS are negative except for above, 10 systems were reviewed  Past Medical History: Past Medical History  Diagnosis Date  . Seasonal allergies   . Anxiety   . Enlarged prostate   . HOH (hard of hearing)     BILATERAL  . History of gout   . Elevated PSA   . Prostate mass   . Arthritis   . Generalized weakness   . Gross hematuria   . Cancer    Past Surgical History  Procedure Laterality Date  . Plantar's wart excision Bilateral   . Cataract extraction w/  intraocular lens  implant, bilateral    . Tonsillectomy  AS CHILD  . Repair subluxing tendon with gastroc slide  01-25-2010    RIGHT FOOT     Medications: Prior to Admission medications   Medication Sig Start Date End Date Taking? Authorizing Provider  allopurinol (ZYLOPRIM) 300 MG tablet Take 300 mg by mouth daily.   Yes Historical Provider, MD   cetirizine (ZYRTEC) 10 MG tablet Take 10 mg by mouth daily.   Yes Historical Provider, MD  Cholecalciferol (VITAMIN D PO) Take 1 tablet by mouth daily.   Yes Historical Provider, MD  citalopram (CELEXA) 20 MG tablet Take 20 mg by mouth daily.   Yes Historical Provider, MD  promethazine (PHENERGAN) 25 MG tablet Take 25-50 mg by mouth every 6 (six) hours as needed for nausea or vomiting.   Yes Historical Provider, MD    Allergies:  No Known Allergies  Social History:  Ambulatory  walker   Lives at  home [phone]  reports that he has never smoked. He has never used smokeless tobacco. He reports that he drinks about 12.6 ounces of alcohol per week. He reports that he does not use illicit drugs.   Family History: family history includes Cancer in his brother, father, and sister.    Physical Exam: Patient Vitals for the past 24 hrs:  BP Temp Temp src Pulse Resp SpO2  10/17/13 2133 113/54 mmHg - - - 23 -  10/17/13 1954 - 102.6 F (39.2 C) Oral - - -  10/17/13 1930 174/85 mmHg - - 108 20 98 %  10/17/13 1900 126/74 mmHg - - 86 17 99 %  10/17/13 1841 - - - - 19 98 %  10/17/13 1830 113/52 mmHg - - 86 17 97 %  10/17/13 1804 - 100.4 F (38 C) Rectal - - -  10/17/13 1800 118/57 mmHg - - 92 18 94 %  10/17/13 1745 115/62 mmHg - - 93 18 93 %  10/17/13 1730 115/64 mmHg - - 98 19 95 %  10/17/13 1700 126/65 mmHg - - 99 18 93 %  10/17/13 1630 126/65 mmHg - - 106 17 92 %  10/17/13 1626 132/66 mmHg - - - 17 92 %  10/17/13 1615 123/64 mmHg - - 109 25 93 %  10/17/13 1600 124/59 mmHg - - 111 18 94 %  10/17/13 1545 122/66 mmHg - - 112 19 93 %  10/17/13 1533 141/57 mmHg 104.4 F (40.2 C) Rectal - 22 93 %  10/17/13 1530 141/57 mmHg - - 114 17 96 %    1. General:  in No Acute distress 2. Psychological: Alert and   Oriented 3. Head/ENT:     Dry Mucous Membranes                          Head Non traumatic, neck supple                          Normal Dentition 4. SKIN:  decreased Skin  turgor,  Skin clean Dry and intact no rash 5. Heart: Regular rate and rhythm no Murmur, Rub or gallop 6. Lungs: Clear to auscultation bilaterally, no wheezes or crackles   7. Abdomen: Soft, non-tender, Non distended 8. Lower extremities: no clubbing, cyanosis, or edema 9. Neurologically the patient appears to have right facial droop which appears to be new from baseline  10. MSK: Normal range of motion  body mass  index is unknown because there is no weight on file.   Labs on Admission:   Recent Labs  10/17/13 1611  NA 137  K 3.9  CL 103  CO2 20  GLUCOSE 138*  BUN 17  CREATININE 1.19  CALCIUM 8.3*    Recent Labs  10/17/13 1611  AST 26  ALT 8  ALKPHOS 159*  BILITOT 2.0*  PROT 6.1  ALBUMIN 2.8*    Recent Labs  10/17/13 1611  LIPASE 27    Recent Labs  10/17/13 1611  WBC 12.4*  NEUTROABS 9.1*  HGB 9.8*  HCT 29.1*  MCV 97.7  PLT 92*   No results found for this basename: CKTOTAL, CKMB, CKMBINDEX, TROPONINI,  in the last 72 hours No results found for this basename: TSH, T4TOTAL, FREET3, T3FREE, THYROIDAB,  in the last 72 hours No results found for this basename: VITAMINB12, FOLATE, FERRITIN, TIBC, IRON, RETICCTPCT,  in the last 72 hours No results found for this basename: HGBA1C    The CrCl is unknown because both a height and weight (above a minimum accepted value) are required for this calculation. ABG    Component Value Date/Time   TCO2 24 10/15/2008 1910     No results found for this basename: DDIMER    UA evidence of UTI  Cultures:    Component Value Date/Time   SDES URINE, RANDOM 09/16/2013 1426   SPECREQUEST NONE 09/16/2013 1426   CULT  Value: NO GROWTH Performed at [facility] 09/16/2013 1426   REPTSTATUS 09/17/2013 FINAL 09/16/2013 1426       Radiological Exams on Admission: Dg Chest Port 1 View  10/17/2013   CLINICAL DATA:  Fever and cough.  EXAM: PORTABLE CHEST - 1 VIEW  COMPARISON:  09/15/2013  FINDINGS: Lordotic technique is  demonstrated. Lungs are adequately inflated. There is mild increased density over the medial right apex which may be due to overlapping bony and soft tissue structures although cannot exclude a pulmonary parenchymal process. Cardiomediastinal silhouette and remainder of the exam is unchanged.  IMPRESSION: Mild increased density in the medial right apex which may be related to overlapping bony and vascular structures with lordotic technique although cannot exclude an underlying pulmonary parenchymal process. Consider upright PA and lateral chest x-ray for better evaluation when able.   Electronically Signed   By: Marin Olp M.D.   On: 10/17/2013 16:30    Chart has been reviewed  Assessment/Plan  The 80 year old gentleman with history of metastatic prostate cancer status post right urostomy tube placement. At this point wishes to [place] only okay with administration of IV fluids and antibiotics.  Present on Admission:  . Sepsis - patient with likely sepsis in the setting of recent percutaneous nephrostomy, now wishes to BE Comfort,  Care but okayed it was giving him IV fluids and antibiotics as needed. Do not wish any  further  interventions will give IV Rocephin given UTI  as a probable cause  . Anemia - in the setting of history of human UA and patient is comfort care at this point  . Prostate cancer - metastatic patient does not wish any treatment at this point comfort care UTI - likely source for sepsis. treat with Rocephin await results of urine culture Right facial droop - patient is comfort care at this point. We'll defer her further evaluation Prophylaxis: SCD , Protonix  CODE STATUS:DNR/DNI comfort care  Other plan as per orders.  I have spent a total of 55 min on this  admission  Jevaun Strick 10/17/2013, 9:44 PM

## 2013-10-17 NOTE — ED Notes (Signed)
Pt. Having generalized weakness, fever and bilateral foot pain.  Pt. Is alert and oriented X4.  Paramedics report fever of 105.  Tylenol 1000 mg po given at 15:00.  Pt. Denies any n/v/d

## 2013-10-17 NOTE — ED Notes (Signed)
Patient resting, shivering due to fever.  2 warm blankets given.  Instructed no more warm blankets due to having fever return.

## 2013-10-17 NOTE — ED Notes (Signed)
NOTIFIED DR. LOCKWOOD IN PERSON OF PATIENTS LAB RESULTS OF CG4+ LACTIC ACID @16 :27 PM ,10/17/2013.

## 2013-10-17 NOTE — Progress Notes (Addendum)
ANTIBIOTIC CONSULT NOTE - INITIAL  Pharmacy Consult for vancomycin, zosyn Indication: sepsis  No Known Allergies  Patient Measurements: Wt= 81kg  Vital Signs: Temp: 104.4 F (40.2 C) (02/14 1533) Temp src: Rectal (02/14 1533) BP: 132/66 mmHg (02/14 1626) Intake/Output from previous day:   Intake/Output from this shift:    Labs:  Recent Labs  10/17/13 1611  WBC 12.4*  HGB 9.8*  PLT PENDING   The CrCl is unknown because both a height and weight (above a minimum accepted value) are required for this calculation. No results found for this basename: VANCOTROUGH, VANCOPEAK, VANCORANDOM, GENTTROUGH, GENTPEAK, GENTRANDOM, TOBRATROUGH, TOBRAPEAK, TOBRARND, AMIKACINPEAK, AMIKACINTROU, AMIKACIN,  in the last 72 hours   Microbiology: No results found for this or any previous visit (from the past 720 hour(s)).  Medical History: Past Medical History  Diagnosis Date  . Seasonal allergies   . Anxiety   . Enlarged prostate   . HOH (hard of hearing)     BILATERAL  . History of gout   . Elevated PSA   . Prostate mass   . Arthritis   . Generalized weakness   . Gross hematuria     Assessment: 80 yo male to begin antibiotics for sepsis. WBC= 12.4, tmax= 104.4, SCr= 1.19, CrCl ~ 50. Vancomycin 1000mg  IV has been ordered in the ED along with zosyn 3.375gm x1.  2/14 vanc>> 2/14 zosyn>>  2/14 urine  Goal of Therapy:  Vancomycin trough level 15-20 mcg/ml  Plan:  -Zosyn 3.375gm IV q8hr  -vancomycin 500mg  IV now for total load of 1500mg  followed by 750mg  IV q12h -Will follow renal function, cultures and clinical progress -Will follow pending labs for maintenance dosing  Hildred Laser, Pharm D 10/17/2013 4:48 PM

## 2013-10-18 ENCOUNTER — Encounter (HOSPITAL_COMMUNITY): Payer: Self-pay | Admitting: General Practice

## 2013-10-18 MED ORDER — SODIUM CHLORIDE 0.9 % IV SOLN
INTRAVENOUS | Status: AC
  Administered 2013-10-18: 01:00:00 via INTRAVENOUS

## 2013-10-18 MED ORDER — ACETAMINOPHEN 325 MG PO TABS: 650.0000 mg | ORAL_TABLET | Freq: Four times a day (QID) | ORAL | Status: DC | PRN

## 2013-10-18 MED ORDER — ACETAMINOPHEN 650 MG RE SUPP: 650.0000 mg | Freq: Four times a day (QID) | RECTAL | Status: DC | PRN

## 2013-10-18 MED ORDER — DOCUSATE SODIUM 100 MG PO CAPS
100.0000 mg | ORAL_CAPSULE | Freq: Two times a day (BID) | ORAL | Status: DC
  Administered 2013-10-18 – 2013-10-20 (×5): 100 mg via ORAL
  Filled 2013-10-18 (×7): qty 1

## 2013-10-18 MED ORDER — ALLOPURINOL 300 MG PO TABS
300.0000 mg | ORAL_TABLET | Freq: Every day | ORAL | Status: DC
  Administered 2013-10-18 – 2013-10-20 (×3): 300 mg via ORAL
  Filled 2013-10-18 (×3): qty 1

## 2013-10-18 MED ORDER — CITALOPRAM HYDROBROMIDE 20 MG PO TABS
20.0000 mg | ORAL_TABLET | Freq: Every day | ORAL | Status: DC
  Administered 2013-10-18 – 2013-10-20 (×3): 20 mg via ORAL
  Filled 2013-10-18 (×3): qty 1

## 2013-10-18 MED ORDER — ONDANSETRON HCL 4 MG PO TABS: 4.0000 mg | ORAL_TABLET | Freq: Four times a day (QID) | ORAL | Status: DC | PRN

## 2013-10-18 MED ORDER — CEFTRIAXONE SODIUM 1 G IJ SOLR
1.0000 g | Freq: Every day | INTRAMUSCULAR | Status: DC
  Administered 2013-10-18 – 2013-10-19 (×3): 1 g via INTRAVENOUS
  Filled 2013-10-18 (×4): qty 10

## 2013-10-18 MED ORDER — LORATADINE 10 MG PO TABS
10.0000 mg | ORAL_TABLET | Freq: Every day | ORAL | Status: DC
  Administered 2013-10-18 – 2013-10-20 (×3): 10 mg via ORAL
  Filled 2013-10-18 (×3): qty 1

## 2013-10-18 MED ORDER — HYDROCODONE-ACETAMINOPHEN 5-325 MG PO TABS
1.0000 | ORAL_TABLET | ORAL | Status: DC | PRN
  Administered 2013-10-18: 1 via ORAL
  Administered 2013-10-19: 2 via ORAL
  Filled 2013-10-18: qty 2
  Filled 2013-10-18: qty 1

## 2013-10-18 MED ORDER — MORPHINE SULFATE 2 MG/ML IJ SOLN: 2.0000 mg | INTRAMUSCULAR | Status: DC | PRN

## 2013-10-18 MED ORDER — ONDANSETRON HCL 4 MG/2ML IJ SOLN: 4.0000 mg | Freq: Four times a day (QID) | INTRAMUSCULAR | Status: DC | PRN

## 2013-10-18 NOTE — Progress Notes (Signed)
Assessment/Plan: Active Problems:   Sepsis - likely urosepsis. Seems to be resolving with abx. Urine culture pending. I don't think BC were done due to long term plan.    Anemia   Prostate cancer - I spoke with wife yesterday by phone prior to them coming to hospital. Her discussion was consonant with what patient and wife discussed with Dr. Roel Cluck: no aggressive therapy. They are hoping to get hospice set up.    Subjective: Feels a little better. Stronger. Did not realize he had a fever.   Objective:  Vital Signs: Filed Vitals:   10/17/13 2308 10/17/13 2330 10/18/13 0010 10/18/13 0659  BP:  99/48 102/51 100/66  Pulse:  87 82 77  Temp: 99.7 F (37.6 C)  98.5 F (36.9 C) 97.3 F (36.3 C)  TempSrc: Rectal  Axillary Axillary  Resp:  18 14 16   Weight:   82.464 kg (181 lb 12.8 oz)   SpO2:  95% 95% 91%     EXAM: Awake, alert, oriented to place.    Intake/Output Summary (Last 24 hours) at 10/18/13 0920 Last data filed at 10/17/13 2130  Gross per 24 hour  Intake      0 ml  Output    225 ml  Net   -225 ml    Lab Results:  Recent Labs  10/17/13 1611  NA 137  K 3.9  CL 103  CO2 20  GLUCOSE 138*  BUN 17  CREATININE 1.19  CALCIUM 8.3*    Recent Labs  10/17/13 1611  AST 26  ALT 8  ALKPHOS 159*  BILITOT 2.0*  PROT 6.1  ALBUMIN 2.8*    Recent Labs  10/17/13 1611  LIPASE 27    Recent Labs  10/17/13 1611  WBC 12.4*  NEUTROABS 9.1*  HGB 9.8*  HCT 29.1*  MCV 97.7  PLT 92*   No results found for this basename: CKTOTAL, CKMB, CKMBINDEX, TROPONINI,  in the last 72 hours BNP No results found for this basename: probnp   No results found for this basename: DDIMER,  in the last 72 hours No results found for this basename: HGBA1C,  in the last 72 hours No results found for this basename: CHOL, HDL, LDLCALC, TRIG, CHOLHDL, LDLDIRECT,  in the last 72 hours No results found for this basename: TSH, T4TOTAL, FREET3, T3FREE, THYROIDAB,  in the last 72 hours No  results found for this basename: VITAMINB12, FOLATE, FERRITIN, TIBC, IRON, RETICCTPCT,  in the last 72 hours  Studies/Results: Dg Chest Port 1 View  10/17/2013   CLINICAL DATA:  Fever and cough.  EXAM: PORTABLE CHEST - 1 VIEW  COMPARISON:  09/15/2013  FINDINGS: Lordotic technique is demonstrated. Lungs are adequately inflated. There is mild increased density over the medial right apex which may be due to overlapping bony and soft tissue structures although cannot exclude a pulmonary parenchymal process. Cardiomediastinal silhouette and remainder of the exam is unchanged.  IMPRESSION: Mild increased density in the medial right apex which may be related to overlapping bony and vascular structures with lordotic technique although cannot exclude an underlying pulmonary parenchymal process. Consider upright PA and lateral chest x-ray for better evaluation when able.   Electronically Signed   By: Marin Olp M.D.   On: 10/17/2013 16:30   Medications: Medications administered in the last 24 hours reviewed.  Current Medication List reviewed.    LOS: 1 day   [facility] @ [facility] [phone]) 10/18/2013, 9:20 AM

## 2013-10-19 ENCOUNTER — Inpatient Hospital Stay (HOSPITAL_COMMUNITY): Payer: Medicare Other

## 2013-10-19 DIAGNOSIS — A419 Sepsis, unspecified organism: Secondary | ICD-10-CM

## 2013-10-19 DIAGNOSIS — Z515 Encounter for palliative care: Secondary | ICD-10-CM

## 2013-10-19 DIAGNOSIS — Z66 Do not resuscitate: Secondary | ICD-10-CM

## 2013-10-19 DIAGNOSIS — C61 Malignant neoplasm of prostate: Secondary | ICD-10-CM

## 2013-10-19 MED ORDER — ENSURE COMPLETE PO LIQD: 237.0000 mL | Freq: Every day | ORAL | Status: DC | PRN

## 2013-10-19 MED ORDER — SENNOSIDES-DOCUSATE SODIUM 8.6-50 MG PO TABS: 2.0000 | ORAL_TABLET | Freq: Every evening | ORAL | Status: DC | PRN

## 2013-10-19 NOTE — Progress Notes (Signed)
   CARE MANAGEMENT NOTE 10/19/2013  Patient:  Michael Estes, Michael Estes   Account Number:  [id number]  Date Initiated:  10/19/2013  Documentation initiated by:  Lizabeth Leyden  Subjective/Objective Assessment:   admitted with sepsis, history of prostate CA with mets.     Action/Plan:   palliative [place] meeting 10/19/2013  plan discharge with home hospice   Anticipated DC Date:  10/20/2013   Anticipated DC Plan:  [employer]  CM consult      PAC Choice  HOSPICE   Choice offered to / List presented to:  C-1 Patient           [place] agency  [facility] [place]   Status of service:  In process, will continue to follow Medicare Important Message given?   (If response is "NO", the following Medicare IM given date fields will be blank) Date Medicare IM given:   Date Additional Medicare IM given:    Discharge Disposition:    Per UR Regulation:    If discussed at Long Length of Stay Meetings, dates discussed:    Comments:  10/19/2013  [place], [place] CM referral: home hospice  Met with patient and spouse to offer choice of home hospice. [facility] selected.  [facility] [place] called, left voice mail message with referral.

## 2013-10-19 NOTE — Progress Notes (Signed)
Notified by Rosette Reveal, patient and family request services of [facility]) after discharge.  Pt seen at bedside sound asleep, spoke with wife Inez Catalina who requested speaking away form pt. Education initiated related to hospice services, philosophy and team approach to care; Inez Catalina voiced good understanding of information provided. Inez Catalina stated her husband and family are all on the same page and that is he wants to 'be comfortable and live until he dies'; she stated he does not want, and has not wanted in past, any aggressive treatments.    Per discussion and notes, plan is for possible  d/c tomorrow; Inez Catalina voiced concern about the weather and stated, for safety reasons, she would request non-emergent transport for her husband- she is not sure she will be able to get to the hospital tomorrow. *Please send completed GOLD DNR form and PINK MOST form in shadow chart home with patient  DME needs were discussed currently pt has a cane and a walker at home; wife is declining a hospital bed, BSC or other equipment, she has the [facility] equipment list and will speak with the team once home for any additional needs.   Patient's PCP Dr Laurann Montana will be the attending physician working with [facility]; pharmacy [facility] Initial paperwork faxed to [facility]  Completed d/c summary will need to be faxed to [place] @ [phone] when final Please notify [facility] when patient is ready to leave unit at d/c call [phone] (or [phone] if after 5 pm);  [facility] information and contact numbers also given to wife Inez Catalina during visit.   Above information shared with Anne Ng, [facility]. Please call with any questions or concerns   Danton Sewer, RN 10/19/2013, 2:45 PM [facility] Liaison [phone]

## 2013-10-19 NOTE — Consult Note (Signed)
Patient [name] Pulse      DOB: 01/19/1932      STM:[id number]     Consult Note from the Palliative Medicine Team at [facility] Requested by: Dr. Roel Cluck     PCP: Irven Shelling, MD Reason for Consultation: [place] and options.    Phone Number:[phone]  Assessment of patients Current state: Michael Estes is 80 yo male with prostate cancer with metastatic disease to bone through skull, left clavicle, manubrium, ribs, spine, and pelvis. He was also found to have involvement of bladder requiring right percutaneous nephrostomy tube placement in January. Michael Estes had increasing weakness at home which lead to hospital admission with urosepsis with chills, hypotension, and fever of 104 F. Michael Estes and his wife met with me this morning and they are clear that they do not want aggressive treatment. He had previously seen Dr. Louis Meckel for possible surgical options but has since decided against surgery and treatment for his cancer. He wishes to focus on comfort. His wife was having difficulty caring for him herself in the home so they are hopeful for hospice support in the home to help with his comfort path. They confirmed DNR and completed MOST form. He designated DNR, full comfort with no return to hospital, and would like to consider antibiotics, IVF, and feeding tube in the future. We did discuss some of those options would require surgical intervention (feeding tube) and hospitalization. They would like to consider these options and not rule them out right now. Michael Estes and his wife have been married 59 years and he has 3 children (living [place], [place], and [place]). She has a daughter that lives close to them and can help support if needed although she does work full time. Michael Estes says that his pain has been controlled with the Vicodin and is not having pain today. I will continue to follow and support holistically.   Goals of Care: 1.  Code Status:  DNR   2. Scope of Treatment: To be determined on outcomes.   4. Disposition: Home with hospice.   3. Symptom Management:   1. Pain: Vicodin prn. Morphine prn if Vicodin does not relieve.  2. Bowel Regimen: Senna S qhs prn.  3. Fever: Acetaminophen prn.  4. Nausea/Vomiting: Ondansetron prn.   4. Psychosocial: Emotional support provided to patient and wife.  5. Spiritual: Spiritual care consulted per patient request.    Patient Documents Completed or Given: Document Given Completed  Advanced Directives Pkt    MOST  yes  DNR  yes  Gone from My Sight    Hard Choices yes     Brief HPI: 80 yo male with metastatic cancer.   ROS: + pain (yesterday - improved today), denies anxiety/nausea    PMH:  Past Medical History  Diagnosis Date  . Seasonal allergies   . Anxiety   . Enlarged prostate   . HOH (hard of hearing)     BILATERAL  . History of gout   . Elevated PSA   . Prostate mass   . Arthritis   . Generalized weakness   . Gross hematuria   . Cancer      PSH: Past Surgical History  Procedure Laterality Date  . Plantar's wart excision Bilateral   . Cataract extraction w/ intraocular lens  implant, bilateral    . Tonsillectomy  AS CHILD  . Repair subluxing tendon with gastroc slide  01-25-2010    RIGHT FOOT   I have reviewed  the FH and SH and  If appropriate update it with new information. No Known Allergies Scheduled Meds: . allopurinol  300 mg Oral Daily  . cefTRIAXone (ROCEPHIN) IVPB 1 gram/50 mL D5W  1 g Intravenous QHS  . citalopram  20 mg Oral Daily  . docusate sodium  100 mg Oral BID  . loratadine  10 mg Oral Daily   Continuous Infusions:  PRN Meds:.acetaminophen, acetaminophen, fentaNYL, HYDROcodone-acetaminophen, morphine injection, ondansetron (ZOFRAN) IV, ondansetron    BP 153/89  Pulse 119  Temp(Src) 99.2 F (37.3 C) (Oral)  Resp 18  Wt 82.464 kg (181 lb 12.8 oz)  SpO2 98%   PPS: 50%   Intake/Output Summary (Last 24 hours) at  10/19/13 1042 Last data filed at 10/18/13 2300  Gross per 24 hour  Intake    600 ml  Output    850 ml  Net   -250 ml   LBM: 10/18/13                Physical Exam:  General:  NAD, pleasant HEENT: [place]/AT, no JVD, moist mucous membranes Chest: CTA throughout, symmetrical, no labored breathing CVS: RRR, S1 S2 Abdomen: Soft, NT, ND, +BS Ext: MAE, no edema, warm to touch Neuro: Alert, oriented x 4, follows commands  Labs: CBC    Component Value Date/Time   WBC 12.4* 10/17/2013 1611   RBC 2.98* 10/17/2013 1611   HGB 9.8* 10/17/2013 1611   HCT 29.1* 10/17/2013 1611   PLT 92* 10/17/2013 1611   MCV 97.7 10/17/2013 1611   MCH 32.9 10/17/2013 1611   MCHC 33.7 10/17/2013 1611   RDW 15.4 10/17/2013 1611   LYMPHSABS 1.9 10/17/2013 1611   MONOABS 1.5* 10/17/2013 1611   EOSABS 0.0 10/17/2013 1611   BASOSABS 0.0 10/17/2013 1611    BMET    Component Value Date/Time   NA 137 10/17/2013 1611   K 3.9 10/17/2013 1611   CL 103 10/17/2013 1611   CO2 20 10/17/2013 1611   GLUCOSE 138* 10/17/2013 1611   BUN 17 10/17/2013 1611   CREATININE 1.19 10/17/2013 1611   CALCIUM 8.3* 10/17/2013 1611   GFRNONAA 56* 10/17/2013 1611   GFRAA 65* 10/17/2013 1611    CMP     Component Value Date/Time   NA 137 10/17/2013 1611   K 3.9 10/17/2013 1611   CL 103 10/17/2013 1611   CO2 20 10/17/2013 1611   GLUCOSE 138* 10/17/2013 1611   BUN 17 10/17/2013 1611   CREATININE 1.19 10/17/2013 1611   CALCIUM 8.3* 10/17/2013 1611   PROT 6.1 10/17/2013 1611   ALBUMIN 2.8* 10/17/2013 1611   AST 26 10/17/2013 1611   ALT 8 10/17/2013 1611   ALKPHOS 159* 10/17/2013 1611   BILITOT 2.0* 10/17/2013 1611   GFRNONAA 56* 10/17/2013 1611   GFRAA 65* 10/17/2013 1611     Time In Time Out Total Time Spent with Patient Total Overall Time  0950 1100 28mn 767m    Greater than 50%  of this time was spent counseling and coordinating care related to the above assessment and plan.   AlVinie SillNP Palliative Medicine Team Pager # [phone]  Phone # [phone]

## 2013-10-19 NOTE — Progress Notes (Signed)
Subjective: Feeling better.    Objective: Vital signs in last 24 hours: Temp:  [97.3 F (36.3 C)-100.4 F (38 C)] 100.4 F (38 C) (02/15 2143) Pulse Rate:  [77-115] 109 (02/15 2143) Resp:  [16-18] 18 (02/15 2143) BP: (100-139)/(53-79) 139/73 mmHg (02/15 2143) SpO2:  [91 %-98 %] 98 % (02/15 2143) Weight change:  Last BM Date: 10/18/13  Intake/Output from previous day: 02/15 0701 - 02/16 0700 In: 960 [P.O.:960] Out: 850 [Urine:850] Intake/Output this shift: Total I/O In: -  Out: 250 [Urine:250]  General appearance: alert and cooperative Resp: clear to auscultation bilaterally Cardio: regular rate and rhythm, S1, S2 normal, no murmur, click, rub or gallop GI: soft, non-tender; bowel sounds normal; no masses,  no organomegaly  Lab Results:  Recent Labs  10/17/13 1611  WBC 12.4*  HGB 9.8*  HCT 29.1*  PLT 92*   BMET  Recent Labs  10/17/13 1611  NA 137  K 3.9  CL 103  CO2 20  GLUCOSE 138*  BUN 17  CREATININE 1.19  CALCIUM 8.3*    Studies/Results: Dg Chest Port 1 View  10/17/2013   CLINICAL DATA:  Fever and cough.  EXAM: PORTABLE CHEST - 1 VIEW  COMPARISON:  09/15/2013  FINDINGS: Lordotic technique is demonstrated. Lungs are adequately inflated. There is mild increased density over the medial right apex which may be due to overlapping bony and soft tissue structures although cannot exclude a pulmonary parenchymal process. Cardiomediastinal silhouette and remainder of the exam is unchanged.  IMPRESSION: Mild increased density in the medial right apex which may be related to overlapping bony and vascular structures with lordotic technique although cannot exclude an underlying pulmonary parenchymal process. Consider upright PA and lateral chest x-ray for better evaluation when able.   Electronically Signed   By: Marin Olp M.D.   On: 10/17/2013 16:30    Medications: I have reviewed the patient's current medications.  Assessment/Plan: Active Problems:  fever -  urine culture negative, source unclear.  Improving with antibiotics.  Recheck CXR Anemia  Prostate cancer - arrange hospice care Disposition  Hope to discharge tomorrow, PT today.    LOS: 2 days   [facility] 10/19/2013, 6:55 AM

## 2013-10-19 NOTE — Evaluation (Signed)
Physical Therapy Evaluation Patient Details Name: Michael Estes MRN: [id number] DOB: [birth year] Today's Date: 10/19/2013 Time: 3220-2542 PT Time Calculation (min): 25 min  PT Assessment / Plan / Recommendation History of Present Illness  Patient with metastatic prostate cancer and recent percutaneous nephrostomy tube presents with weakness, fever, chills and urinary incontinence.   Clinical Impression  Patient presents with decreased independence with mobility and will benefit from skilled PT in the acute setting to allow return home with wife assist and HHPT.    PT Assessment  Patient needs continued PT services    Follow Up Recommendations  Home health PT    Does the patient have the potential to tolerate intense rehabilitation    N/A  Barriers to Discharge   None       Equipment Recommendations  3in1 (PT)    Recommendations for Other Services   None  Frequency Min 3X/week    Precautions / Restrictions Precautions Precautions: Fall Precaution Comments: fall prior to admission was first in long time Restrictions Weight Bearing Restrictions: No   Pertinent Vitals/Pain No current pain, had pain yesterday      Mobility  Bed Mobility General bed mobility comments: NT pt up in chair reports able to get up this morning without too much difficulty Transfers Overall transfer level: Needs assistance Equipment used: Rolling walker (2 wheeled) Transfers: Sit to/from Stand Sit to Stand: Supervision General transfer comment: stood from and sat in chair using armrests appropriately without cues, supervision for safety Ambulation/Gait Ambulation/Gait assistance: Min guard Ambulation Distance (Feet): 180 Feet Assistive device: Rolling walker (2 wheeled) Gait Pattern/deviations: Step-through pattern;Decreased stride length;Wide base of support General Gait Details: feet wider than walker and staying posterior to walker, needs minguard for safety, no loss of balance noted.     Exercises     PT Diagnosis: Abnormality of gait  PT Problem List: Decreased strength;Decreased mobility;Decreased balance;Decreased knowledge of use of DME;Decreased activity tolerance PT Treatment Interventions: DME instruction;Therapeutic exercise;Gait training;Balance training;Functional mobility training;Therapeutic activities;Patient/family education     PT Goals(Current goals can be found in the care plan section) Acute Rehab PT Goals Patient Stated Goal: To return home with wife assist PT Goal Formulation: With patient Time For Goal Achievement: 11/02/13 Potential to Achieve Goals: Good  Visit Information  Last PT Received On: 10/19/13 Assistance Needed: +1 History of Present Illness: Patient with metastatic prostate cancer and recent percutaneous nephrostomy tube presents with weakness, fever, chills and urinary incontinence.        Prior [place] expects to be discharged to:: Private residence Living Arrangements: Spouse/significant other Available Help at Discharge: Family;Available 24 hours/day Type of Home: Apartment (condo) Home Access: Elevator Home Layout: One level Home Equipment: Walker - 2 wheels Prior Function Level of Independence: Independent Comments: occasional use of walker Communication Communication: No difficulties    Cognition  Cognition Arousal/Alertness: Awake/alert Behavior During Therapy: WFL for tasks assessed/performed Overall Cognitive Status: Within Functional Limits for tasks assessed    Extremity/Trunk Assessment Lower Extremity Assessment Lower Extremity Assessment: RLE deficits/detail;LLE deficits/detail RLE Deficits / Details: AAROM WFL, strength hip flexion 3-/5, knee extension 4-/5, ankle DR 4+/5 LLE Deficits / Details: AAROM WFL, strength hip flexion 3-/5, knee extension 4-/5, ankle DR 4+/5   Balance Balance Overall balance assessment: Needs assistance;History of Falls Standing balance  support: Bilateral upper extremity supported Standing balance-Leahy Scale: Poor Standing balance comment: UE support needed for balance  End of Session PT - End of Session Equipment Utilized During Treatment: Gait belt  Activity Tolerance: Patient tolerated treatment well Patient left: in chair;with call bell/phone within reach;with chair alarm set  GP     [facility] 10/19/2013, 9:42 AM Magda Kiel, [place] 10/19/2013

## 2013-10-19 NOTE — Progress Notes (Signed)
Chaplain responded to spiritual consult for end of life. Chaplain spoke to pt and wife entered shortly after. Pt stated that he found out in January and that it is "all over." Family stated that they are members of a church here and have very good support. Chaplain provided empathic listening and prayer.

## 2013-10-19 NOTE — Progress Notes (Addendum)
INITIAL NUTRITION ASSESSMENT  DOCUMENTATION CODES Per approved criteria  -Severe malnutrition in the context of chronic illness   INTERVENTION: Agree with Regular diet. Add Ensure Complete po daily prn. RD to continue to follow nutrition care plan.  NUTRITION DIAGNOSIS: Increased nutrient needs related to catabolic illness as evidenced by estimated needs.   Goal: Intake to meet >90% of estimated nutrition needs.  Monitor:  weight trends, lab trends, I/O's, PO intake, supplement tolerance  Reason for Assessment: Malnutrition Screening Tool  80 y.o. male  Admitting Dx: sepsis  ASSESSMENT: PMHx significant for gout, anxiety and metastatic prostate cancer. S/p nephrostomy tube placed in January. Admitted with progressive weakness and urinary incontinence; developed fevers and chills. Work-up reveals sepsis.  Pt is currently ordered for a Regular diet, eating 75% of meals. RD attempted to speak with patient x 2, however pt unavailable.  Pt seen by palliative care team this morning. Plan is for pt to d/c home with hospice.  Pt seen during previous hospitalization and dx with malnutrition at that time; pt continues to meet criteria for severe MALNUTRITION in the context of chronic illness as evidenced by intake of <75% x at least 1 month and 5% wt loss x 1 month.  Height: Ht Readings from Last 1 Encounters:  09/15/13 6' (1.829 m)    Weight: Wt Readings from Last 1 Encounters:  10/18/13 181 lb 12.8 oz (82.464 kg)    Ideal Body Weight: 178 lb  % Ideal Body Weight: 102%  Wt Readings from Last 10 Encounters:  10/18/13 181 lb 12.8 oz (82.464 kg)  09/15/13 179 lb 6.4 oz (81.375 kg)    Usual Body Weight: 190 lb  % Usual Body Weight: 95%  BMI:  Body mass index is 24.65 kg/(m^2). Normal weight  Estimated Nutritional Needs: Kcal: 2000 - 2200 Protein: at least 105 grams protein daily Fluid: 2 - 2.2 liters daily  Skin: intact  Diet Order: General  EDUCATION  NEEDS: -No education needs identified at this time   Intake/Output Summary (Last 24 hours) at 10/19/13 1429 Last data filed at 10/18/13 2300  Gross per 24 hour  Intake    360 ml  Output    850 ml  Net   -490 ml    Last BM: 2/15  Labs:   Recent Labs Lab 10/17/13 1611  NA 137  K 3.9  CL 103  CO2 20  BUN 17  CREATININE 1.19  CALCIUM 8.3*  GLUCOSE 138*    CBG (last 3)  No results found for this basename: GLUCAP,  in the last 72 hours  Scheduled Meds: . allopurinol  300 mg Oral Daily  . cefTRIAXone (ROCEPHIN) IVPB 1 gram/50 mL D5W  1 g Intravenous QHS  . citalopram  20 mg Oral Daily  . docusate sodium  100 mg Oral BID  . loratadine  10 mg Oral Daily    Continuous Infusions:   Past Medical History  Diagnosis Date  . Seasonal allergies   . Anxiety   . Enlarged prostate   . HOH (hard of hearing)     BILATERAL  . History of gout   . Elevated PSA   . Prostate mass   . Arthritis   . Generalized weakness   . Gross hematuria   . Cancer     Past Surgical History  Procedure Laterality Date  . Plantar's wart excision Bilateral   . Cataract extraction w/ intraocular lens  implant, bilateral    . Tonsillectomy  AS CHILD  .  Repair subluxing tendon with gastroc slide  01-25-2010    RIGHT FOOT    Inda Coke MS, RD, LDN Inpatient Registered Dietitian Pager: [phone] After-hours pager: [phone]

## 2013-10-19 NOTE — Progress Notes (Signed)
Thank you for consulting the Palliative Medicine Team at [facility] to meet your patient's and family's needs.   The reason that you asked [place] to see your patient is for Michael Estes and options.  We have scheduled your patient for a meeting: 10/19/13 10am  The Surrogate decision make is: Michael Estes Contact information: [phone]  Other family members that need to be present: At the [facility] discretion.     Your patient is able/unable to participate: able  Vinie Sill, NP Palliative Medicine Team Pager # [phone] Team Phone # [phone]

## 2013-10-20 LAB — GLUCOSE, CAPILLARY: Glucose-Capillary: 83 mg/dL (ref 70–99)

## 2013-10-20 LAB — URINE CULTURE

## 2013-10-20 MED ORDER — HYDROCODONE-ACETAMINOPHEN 5-325 MG PO TABS: 1.0000 | ORAL_TABLET | ORAL | Status: DC | PRN

## 2013-10-20 MED ORDER — AMOXICILLIN 875 MG PO TABS: 875.0000 mg | ORAL_TABLET | Freq: Two times a day (BID) | ORAL | Status: DC

## 2013-10-20 NOTE — Discharge Summary (Signed)
Physician Discharge Summary  Patient ID: Michael Estes MRN: [id number] DOB/AGE: 12/26/1931 80 y.o.  Admit date: 10/17/2013 Discharge date: 10/20/2013  Admission Diagnoses: Urosepsis UTI Metastatic prostate cancer Anemia  Discharge Diagnoses:  Active Problems:   Urosepsis UTI   Anemia   Metastatic Prostate cancer   Palliative care encounter   DNR (do not resuscitate)   Discharged Condition: good  Hospital Course: Patient with recently diagnosed metastatic prostate cancer who presented with high fever up to 104, chills and slightly hypotensive and generalized weakness and urinary incontinence. Urinalysis suggested UTI. The patient was started on Rocephin and IV fluids. At his wishes he was this made no CODE BLUE. He quickly defervesced and felt considerably better within 24 hours. He continued to run low-grade temperature of 100 - 100.5. Urine culture grew pansensitive enterococcus. He was switched to amoxicillin at discharge. At his request he was seen by palliative care and outpatient hospice was arranged. He was seen by physical therapy and home physical therapy was arranged.  Consults: Palliative care  Significant Diagnostic Studies: labs: Sodium 137, potassium 3.9, chloride 103, bicarbonate 20, BUN 17, creatinine 1.19, WBC 12.4, hemoglobin 9.8, platelet 92 microbiology: urine culture: positive for Enterococcus and radiology: CXR: normal  Treatments: IV hydration and antibiotics: ceftriaxone  Discharge Exam: Blood pressure 116/65, pulse 78, temperature 97.8 F (36.6 C), temperature source Oral, resp. rate 18, weight 83.57 kg (184 lb 3.8 oz), SpO2 98.00%. General appearance: alert and cooperative Resp: clear to auscultation bilaterally Cardio: regular rate and rhythm, S1, S2 normal, no murmur, click, rub or gallop  Disposition: 06-Home-Health Care Svc     Medication List         allopurinol 300 MG tablet  Commonly known as:  ZYLOPRIM  Take 300 mg by mouth daily.      amoxicillin 875 MG tablet  Commonly known as:  AMOXIL  Take 1 tablet (875 mg total) by mouth 2 (two) times daily.     cetirizine 10 MG tablet  Commonly known as:  ZYRTEC  Take 10 mg by mouth daily.     citalopram 20 MG tablet  Commonly known as:  CELEXA  Take 20 mg by mouth daily.     HYDROcodone-acetaminophen 5-325 MG per tablet  Commonly known as:  NORCO/VICODIN  Take 1-2 tablets by mouth every 4 (four) hours as needed for moderate pain.     promethazine 25 MG tablet  Commonly known as:  PHENERGAN  Take 25-50 mg by mouth every 6 (six) hours as needed for nausea or vomiting.     VITAMIN D PO  Take 1 tablet by mouth daily.           Follow-up Information   Follow up with [facility] ([facility] to follow aftr d/c pls notify whn pt ready to leave hospital call [phone] (or [phone] afer 5pm))    Contact information:   [facility] [place] [phone]      Follow up In 2 weeks.      Follow up with Irven Shelling, MD In 2 weeks.   Specialty:  Internal Medicine   Contact information:   [address], [address] [place] [address] [phone]       Signed: Irven Shelling 10/20/2013, 9:01 AM

## 2013-10-20 NOTE — Clinical Documentation Improvement (Signed)
THIS DOCUMENT IS NOT A PERMANENT PART OF THE MEDICAL RECORD  Please update your documentation with the medical record to reflect your response to this query. If you need help knowing how to do this please call [phone].  10/20/13   Dear Dr. Laurann [facility] / [facility],  In a better effort to capture your patient's severity of illness, reflect appropriate length of stay and utilization of resources, a review of the patient medical record has revealed the following indicators.    Based on your clinical judgment, please clarify and document in a progress note and/or discharge summary the clinical condition associated with the following supporting information:  In responding to this query please exercise your independent judgment.  The fact that a query is asked, does not imply that any particular answer is desired or expected.  Possible Clinical Conditions?  Severe Malnutrition   Severe Protein Calorie Malnutrition Other Condition Cannot clinically determine   Supporting Information: Nutrition Asessment by Erlene Quan, RD at 10/19/2013  2:29 PM  ASSESSMENT:  Pt seen during previous hospitalization and dx with malnutrition at that time; pt continues to meet criteria for severe MALNUTRITION in the context of chronic illness as evidenced by intake of <75% x at least 1 month and 5% wt loss x 1 month.   INTERVENTION: Agree with Regular diet. Add Ensure Complete po daily prn. RD to continue to follow nutrition care plan.   You may use possible, probable, or suspect with inpatient documentation. possible, probable, suspected diagnoses MUST be documented at the time of discharge  Reviewed:  no additional documentation provided  NO RESPONSE  Thank You,  Alessandra Grout RN, BSN, CCDS, Clinical Documentation Specialist: [phone] [phone] [place]

## 2013-10-20 NOTE — Progress Notes (Signed)
   CARE MANAGEMENT NOTE 10/20/2013  Patient:  Michael Estes, Michael Estes   Account Number:  [id number]  Date Initiated:  10/19/2013  Documentation initiated by:  Lizabeth Leyden  Subjective/Objective Assessment:   admitted with sepsis, history of prostate CA with mets.     Action/Plan:   palliative [place] meeting 10/19/2013  plan discharge with home hospice   Anticipated DC Date:  10/20/2013   Anticipated DC Plan:  [employer]  CM consult      PAC Choice  HOSPICE   Choice offered to / List presented to:  C-1 Patient           [place] agency  [facility] [place]   Status of service:  Completed, signed off Medicare Important Message given?   (If response is "NO", the following Medicare IM given date fields will be blank) Date Medicare IM given:   Date Additional Medicare IM given:    Discharge Disposition:  [place]  Per UR Regulation:    If discussed at Long Length of Stay Meetings, dates discussed:    Comments:  10/20/2013  [place], [place] Met with patient and spouse regarding transport to home. The patient states his daughter will take him home via car around noontime.  [facility] to advise of dc to home via car. DC summary faxed to [facility].  10/19/2013  [place], [place] CM referral: home hospice  Met with patient and spouse to offer choice of home hospice. [facility] selected.  [facility] called, left voice mail message with referral.

## 2013-10-20 NOTE — Progress Notes (Signed)
Patient discharged.  Patient educated on discharge instructions, discharge medications, and follow-up appointments.  Patient's wife requested that she make her own follow-up appointment.  Phone number given to wife with AVS.  Patient and wife verbalized understanding of discharge instructions, AVS signed.   Patient given prescriptions for amoxicillin and vicodin.  IV removed.  Belongings gathered.  Patient escorted via wheelchair to main entrance with NT.

## 2013-10-27 ENCOUNTER — Emergency Department (HOSPITAL_COMMUNITY)
Admission: EM | Admit: 2013-10-27 | Discharge: 2013-10-28 | Disposition: A | Discharge status: Home / Self Care | Attending: Emergency Medicine | Admitting: Emergency Medicine

## 2013-10-27 ENCOUNTER — Encounter (HOSPITAL_COMMUNITY): Payer: Self-pay | Admitting: Emergency Medicine

## 2013-10-27 DIAGNOSIS — N390 Urinary tract infection, site not specified: Secondary | ICD-10-CM | POA: Insufficient documentation

## 2013-10-27 DIAGNOSIS — Z79899 Other long term (current) drug therapy: Secondary | ICD-10-CM | POA: Insufficient documentation

## 2013-10-27 DIAGNOSIS — Z792 Long term (current) use of antibiotics: Secondary | ICD-10-CM | POA: Insufficient documentation

## 2013-10-27 DIAGNOSIS — Z85038 Personal history of other malignant neoplasm of large intestine: Secondary | ICD-10-CM | POA: Insufficient documentation

## 2013-10-27 DIAGNOSIS — F411 Generalized anxiety disorder: Secondary | ICD-10-CM | POA: Insufficient documentation

## 2013-10-27 DIAGNOSIS — R809 Proteinuria, unspecified: Secondary | ICD-10-CM | POA: Insufficient documentation

## 2013-10-27 DIAGNOSIS — R1032 Left lower quadrant pain: Secondary | ICD-10-CM | POA: Insufficient documentation

## 2013-10-27 DIAGNOSIS — Z8739 Personal history of other diseases of the musculoskeletal system and connective tissue: Secondary | ICD-10-CM | POA: Insufficient documentation

## 2013-10-27 DIAGNOSIS — I10 Essential (primary) hypertension: Secondary | ICD-10-CM | POA: Insufficient documentation

## 2013-10-27 DIAGNOSIS — R109 Unspecified abdominal pain: Secondary | ICD-10-CM

## 2013-10-27 LAB — CBC WITH DIFFERENTIAL/PLATELET
BASOS ABS: 0.1 10*3/uL (ref 0.0–0.1)
Basophils Relative: 0 % (ref 0–1)
EOS PCT: 0 % (ref 0–5)
Eosinophils Absolute: 0 10*3/uL (ref 0.0–0.7)
HCT: 32.6 % — ABNORMAL LOW (ref 39.0–52.0)
Hemoglobin: 11.1 g/dL — ABNORMAL LOW (ref 13.0–17.0)
LYMPHS PCT: 13 % (ref 12–46)
Lymphs Abs: 2.3 10*3/uL (ref 0.7–4.0)
MCH: 32.8 pg (ref 26.0–34.0)
MCHC: 34 g/dL (ref 30.0–36.0)
MCV: 96.4 fL (ref 78.0–100.0)
Monocytes Absolute: 1.2 10*3/uL — ABNORMAL HIGH (ref 0.1–1.0)
Monocytes Relative: 7 % (ref 3–12)
Neutro Abs: 14.6 10*3/uL — ABNORMAL HIGH (ref 1.7–7.7)
Neutrophils Relative %: 80 % — ABNORMAL HIGH (ref 43–77)
PLATELETS: 172 10*3/uL (ref 150–400)
RBC: 3.38 MIL/uL — ABNORMAL LOW (ref 4.22–5.81)
RDW: 14.9 % (ref 11.5–15.5)
WBC: 18.2 10*3/uL — AB (ref 4.0–10.5)

## 2013-10-27 LAB — I-STAT TROPONIN, ED: Troponin i, poc: 0.02 ng/mL (ref 0.00–0.08)

## 2013-10-27 MED ORDER — HYDROCODONE-ACETAMINOPHEN 5-325 MG PO TABS
2.0000 | ORAL_TABLET | Freq: Once | ORAL | Status: AC
  Administered 2013-10-27: 2 via ORAL
  Filled 2013-10-27: qty 2

## 2013-10-27 NOTE — ED Notes (Signed)
Presents with nausea, vomiting, weakness, left flank pain and loose stools began today associated with weakness, stage 4 colon cancer patient. Pt is warm to touch. Recently hospitalized one week ago. Blood pressure 210/100.  Reports slight blurry vision and headache. Alert, oriented and maex4.  Lives at home with wife.

## 2013-10-28 ENCOUNTER — Encounter (HOSPITAL_COMMUNITY): Payer: Self-pay | Admitting: Radiology

## 2013-10-28 ENCOUNTER — Emergency Department (HOSPITAL_COMMUNITY)

## 2013-10-28 LAB — COMPREHENSIVE METABOLIC PANEL
ALK PHOS: 260 U/L — AB (ref 39–117)
ALT: 12 U/L (ref 0–53)
AST: 39 U/L — AB (ref 0–37)
Albumin: 3.1 g/dL — ABNORMAL LOW (ref 3.5–5.2)
BILIRUBIN TOTAL: 1.2 mg/dL (ref 0.3–1.2)
BUN: 10 mg/dL (ref 6–23)
CALCIUM: 9.4 mg/dL (ref 8.4–10.5)
CHLORIDE: 102 meq/L (ref 96–112)
CO2: 22 meq/L (ref 19–32)
Creatinine, Ser: 0.9 mg/dL (ref 0.50–1.35)
GFR, EST NON AFRICAN AMERICAN: 79 mL/min — AB (ref >90)
GLUCOSE: 129 mg/dL — AB (ref 70–99)
Potassium: 4.9 mEq/L (ref 3.7–5.3)
SODIUM: 142 meq/L (ref 137–147)
Total Protein: 6.8 g/dL (ref 6.0–8.3)

## 2013-10-28 LAB — URINALYSIS, ROUTINE W REFLEX MICROSCOPIC
BILIRUBIN URINE: NEGATIVE
Glucose, UA: NEGATIVE mg/dL
Ketones, ur: NEGATIVE mg/dL
Nitrite: NEGATIVE
PH: 7 (ref 5.0–8.0)
Specific Gravity, Urine: 1.015 (ref 1.005–1.030)
Urobilinogen, UA: 0.2 mg/dL (ref 0.0–1.0)

## 2013-10-28 LAB — URINE MICROSCOPIC-ADD ON

## 2013-10-28 MED ORDER — IOHEXOL 300 MG/ML  SOLN
100.0000 mL | Freq: Once | INTRAMUSCULAR | Status: AC | PRN
  Administered 2013-10-28: 80 mL via INTRAVENOUS

## 2013-10-28 MED ORDER — ONDANSETRON 4 MG PO TBDP: 4.0000 mg | ORAL_TABLET | Freq: Three times a day (TID) | ORAL | Status: AC | PRN

## 2013-10-28 MED ORDER — HYDROMORPHONE HCL PF 1 MG/ML IJ SOLN
0.5000 mg | Freq: Once | INTRAMUSCULAR | Status: AC
  Administered 2013-10-28: 0.5 mg via INTRAVENOUS
  Filled 2013-10-28: qty 1

## 2013-10-28 MED ORDER — OXYCODONE-ACETAMINOPHEN 5-325 MG PO TABS: 2.0000 | ORAL_TABLET | ORAL | Status: AC | PRN

## 2013-10-28 MED ORDER — ONDANSETRON HCL 4 MG/2ML IJ SOLN
4.0000 mg | Freq: Once | INTRAMUSCULAR | Status: AC
  Administered 2013-10-28: 4 mg via INTRAVENOUS
  Filled 2013-10-28: qty 2

## 2013-10-28 MED ORDER — DEXTROSE 5 % IV SOLN
INTRAVENOUS | Status: AC
  Filled 2013-10-28: qty 10

## 2013-10-28 MED ORDER — DEXTROSE 5 % IV SOLN
1.0000 g | Freq: Once | INTRAVENOUS | Status: AC
  Administered 2013-10-28: 1 g via INTRAVENOUS

## 2013-10-28 MED ORDER — CEPHALEXIN 500 MG PO CAPS: 500.0000 mg | ORAL_CAPSULE | Freq: Four times a day (QID) | ORAL | Status: AC

## 2013-10-28 NOTE — Discharge Instructions (Signed)
Please call your doctor for a followup appointment within 24-48 hours. When you talk to your doctor please let them know that you were seen in the emergency department and have them acquire all of your records so that they can discuss the findings with you and formulate a treatment plan to fully care for your new and ongoing problems. ° °

## 2013-10-28 NOTE — ED Notes (Signed)
Called [facility] to arrange patient transport back home.

## 2013-10-28 NOTE — ED Notes (Signed)
Upon return from [place], Transport informed RN that patient hit his hand on the railing and his IV came loose. CT technicians fixed IV. However, IV was pointing toward fingers and not toward heart. Patient gave verbal consent for RN to take a picture of patients IV site for documentation of incident.

## 2013-10-28 NOTE — ED Provider Notes (Signed)
CSN: [id number]     Arrival date & time 10/27/13  2133 History   First MD Initiated Contact with Patient 10/27/13 2344     Chief Complaint  Patient presents with  . Hypertension  . Nausea     (Consider location/radiation/quality/duration/timing/severity/associated sxs/prior Treatment) HPI Comments: 80 year old male, history of metastatic colon cancer, history of urinary Foley catheter placed in the last couple of months which has not been changed, history of hypertension. He presents with one day of left-sided abdominal and flank pain which is rather persistent, severe, not associated with diarrhea but is associated with nausea and vomiting. He reports an episode of what appeared to be some blood in his stool yesterday but this was an isolated episode. He denies any other abdominal surgery.  Patient is a 80 y.o. male presenting with hypertension. The history is provided by the patient and a relative.  Hypertension    Past Medical History  Diagnosis Date  . Seasonal allergies   . Anxiety   . Enlarged prostate   . HOH (hard of hearing)     BILATERAL  . History of gout   . Elevated PSA   . Prostate mass   . Arthritis   . Generalized weakness   . Gross hematuria   . Cancer    Past Surgical History  Procedure Laterality Date  . Plantar's wart excision Bilateral   . Cataract extraction w/ intraocular lens  implant, bilateral    . Tonsillectomy  AS CHILD  . Repair subluxing tendon with gastroc slide  01-25-2010    RIGHT FOOT   Family History  Problem Relation Age of Onset  . Cancer Father   . Cancer Sister   . Cancer Brother    History  Substance Use Topics  . Smoking status: Never Smoker   . Smokeless tobacco: Never Used  . Alcohol Use: 12.6 oz/week    21 Shots of liquor per week     Comment: 09/15/2013 "3-4 mixed drinks/day; not so much since the end of 2014"    Review of Systems  All other systems reviewed and are negative.      Allergies  Review of  patient's allergies indicates no known allergies.  Home Medications   Current Outpatient Rx  Name  Route  Sig  Dispense  Refill  . allopurinol (ZYLOPRIM) 300 MG tablet   Oral   Take 300 mg by mouth daily.         . Cholecalciferol (VITAMIN D) 2000 UNITS tablet   Oral   Take 2,000 Units by mouth daily.         . citalopram (CELEXA) 20 MG tablet   Oral   Take 20 mg by mouth daily.         Marland Kitchen HYDROcodone-acetaminophen (NORCO/VICODIN) 5-325 MG per tablet   Oral   Take 2 tablets by mouth 2 (two) times daily as needed for moderate pain.         Marland Kitchen amoxicillin (AMOXIL) 875 MG tablet   Oral   Take 875 mg by mouth 2 (two) times daily. 5 day course completed 10/24/13         . cephALEXin (KEFLEX) 500 MG capsule   Oral   Take 1 capsule (500 mg total) by mouth 4 (four) times daily.   40 capsule   0   . ondansetron (ZOFRAN ODT) 4 MG disintegrating tablet   Oral   Take 1 tablet (4 mg total) by mouth every 8 (eight) hours as needed for nausea.  10 tablet   0   . oxyCODONE-acetaminophen (PERCOCET/ROXICET) 5-325 MG per tablet   Oral   Take 2 tablets by mouth every 4 (four) hours as needed for severe pain.   6 tablet   0    BP 164/90  Pulse 75  Temp(Src) 98.4 F (36.9 C) (Oral)  Resp 11  SpO2 96% Physical Exam  Nursing note and vitals reviewed. Constitutional: He appears well-developed and well-nourished. No distress.  HENT:  Head: Normocephalic and atraumatic.  Mouth/Throat: Oropharynx is clear and moist. No oropharyngeal exudate.  Eyes: Conjunctivae and EOM are normal. Pupils are equal, round, and reactive to light. Right eye exhibits no discharge. Left eye exhibits no discharge. No scleral icterus.  Neck: Normal range of motion. Neck supple. No JVD present. No thyromegaly present.  Cardiovascular: Normal rate, regular rhythm, normal heart sounds and intact distal pulses.  Exam reveals no gallop and no friction rub.   No murmur heard. Pulmonary/Chest: Effort  normal and breath sounds normal. No respiratory distress. He has no wheezes. He has no rales.  Abdominal: Soft. Bowel sounds are normal. He exhibits distension. He exhibits no mass. There is tenderness. There is no rebound and no guarding.  Tender to palpation across the left abdomen, left lower quadrant, left flank, distended with tympanitic sounds to percussion, decreased bowel sounds, no pain at McBurney's point, no right upper quadrant tenderness or Murphy sign  Nephrostomy tube  Musculoskeletal: Normal range of motion. He exhibits no edema and no tenderness.  Lymphadenopathy:    He has no cervical adenopathy.  Neurological: He is alert. Coordination normal.  Skin: Skin is warm and dry. No rash noted. No erythema.  Psychiatric: He has a normal mood and affect. His behavior is normal.    ED Course  Procedures (including critical care time) Labs Review Labs Reviewed  CBC WITH DIFFERENTIAL - Abnormal; Notable for the following:    WBC 18.2 (*)    RBC 3.38 (*)    Hemoglobin 11.1 (*)    HCT 32.6 (*)    Neutrophils Relative % 80 (*)    Neutro Abs 14.6 (*)    Monocytes Absolute 1.2 (*)    All other components within normal limits  COMPREHENSIVE METABOLIC PANEL - Abnormal; Notable for the following:    Glucose, Bld 129 (*)    Albumin 3.1 (*)    AST 39 (*)    Alkaline Phosphatase 260 (*)    GFR calc non Af Amer 79 (*)    All other components within normal limits  URINALYSIS, ROUTINE W REFLEX MICROSCOPIC - Abnormal; Notable for the following:    APPearance CLOUDY (*)    Hgb urine dipstick LARGE (*)    Protein, ur >300 (*)    Leukocytes, UA SMALL (*)    All other components within normal limits  URINE MICROSCOPIC-ADD ON - Abnormal; Notable for the following:    Bacteria, UA FEW (*)    All other components within normal limits  I-STAT TROPOININ, ED   Imaging Review Ct Abdomen Pelvis W Contrast  2013-11-25   CLINICAL DATA:  Nausea, vomiting and weakness.  Left flank pain.  EXAM:  CT ABDOMEN AND PELVIS WITH CONTRAST  TECHNIQUE: Multidetector CT imaging of the abdomen and pelvis was performed using the standard protocol following bolus administration of intravenous contrast.  CONTRAST:  57mL OMNIPAQUE IOHEXOL 300 MG/ML  SOLN  COMPARISON:  CT of the abdomen and pelvis, and renal ultrasound, performed 09/15/2013  FINDINGS: Mild bibasilar atelectasis is noted. Mild  scattered coronary artery calcifications are seen.  There is prominence of periaortic and paracaval nodes, measuring up to 2.0 cm in short axis, with nonspecific associated retroperitoneal stranding. This may reflect the patient's known metastatic disease, and appears grossly stable from the prior study.  The liver and spleen are unremarkable in appearance. The gallbladder is within normal limits. The pancreas and adrenal glands are unremarkable.  Nonspecific perinephric stranding is noted bilaterally, with increased fluid about Gerota's fascia on the left side. This is nonspecific. There is no definite evidence for pyelonephritis. A right-sided nephrostomy tube is noted in expected position. There is no evidence of hydronephrosis. No renal or ureteral stones are seen.  No free fluid is identified. The small bowel is unremarkable in appearance. The stomach is within normal limits. No acute vascular abnormalities are seen. Scattered calcification is seen along the abdominal aorta and its branches. The abdominal aorta is relatively tortuous in appearance.  The appendix is diminutive, without evidence for appendicitis. The sigmoid colon is somewhat redundant and is grossly unremarkable in appearance.  The bladder is mildly distended. The prostate remains diffusely enlarged and heterogeneous, with extension into the base of the bladder, compatible with the patient's known prostate malignancy. The size of the prostate mass is grossly unchanged from the prior study. No inguinal lymphadenopathy is seen.  No acute osseous abnormalities are  identified. Multilevel bony metastatic disease is again noted along the visualized lower thoracic and lumbar spine, and within the osseous structures of the pelvis.  IMPRESSION: 1. Mildly increased fluid about Gerota's fascia, nonspecific in appearance. No definite evidence for pyelonephritis, though mild infection cannot be entirely excluded, depending on the patient's symptoms. 2. Otherwise no significant change from recent prior studies. 3. Diffusely enlarged and heterogeneous prostate again noted, with extension into the base of the bladder, compatible with the patient's known prostate malignancy. This is grossly unchanged in size from the prior study. 4. Prominent periaortic and paracaval nodes, measuring up to 2.0 cm in short axis, with nonspecific associated retroperitoneal stranding. This may reflect metastatic disease, and appears grossly stable. 5. Multilevel bony metastatic disease again noted along the visualized thoracolumbar spine and within the pelvis. 6. Mild scattered coronary artery calcifications seen. 7. Scattered calcification along the abdominal aorta and its branches. 8. Mild bibasilar atelectasis noted.   Electronically Signed   By: Garald Balding M.D.   On: 10/10/2013 02:45    EKG Interpretation   None       MDM   Final diagnoses:  Abdominal pain  UTI (lower urinary tract infection)  Proteinuria    The patient has abdominal discomfort with associated flank discomfort, his urinary catheter is in place, the urine in the back does not appear particularly cloudy however it is dark in appearance. He does have multiple risk factors for small bowel obstruction including cancer, he also has risk factors for complicated urinary infections with a Foley catheter in place. Would consider a stone, diverticulitis, abdominal aortic aneurysm. Possible renal abscess. We'll change Foley catheter, urine sample, labs show leukocytosis, CT scan pending, IV pain medication and nausea  medication.  The CT scan reveals multiple abnormalities most of which are chronic and have been seen on prior CT scans. He has no signs of renal abscess or obvious pyelonephritis, his only abnormal findings have been a leukocytosis and hematuria with proteinuria. He has been informed of these results, Keflex will be started, Rocephin given in the emergency department. He will follow up with his family doctor. He has  had significant improvement in his symptoms after one dose of pain medication. No signs of bowel obstruction diverticulitis or other significant abdominal pathology which is acute and will require further evaluation or admission.  Meds given in ED:  Medications  cefTRIAXone (ROCEPHIN) 1 g in dextrose 5 % 50 mL IVPB (not administered)  HYDROcodone-acetaminophen (NORCO/VICODIN) 5-325 MG per tablet 2 tablet (2 tablets Oral Given 10/27/13 2243)  HYDROmorphone (DILAUDID) injection 0.5 mg (0.5 mg Intravenous Given 10/11/2013 0029)  ondansetron (ZOFRAN) injection 4 mg (4 mg Intravenous Given 10/24/2013 0029)  iohexol (OMNIPAQUE) 300 MG/ML solution 100 mL (80 mLs Intravenous Contrast Given 10/18/2013 0148)    New Prescriptions   CEPHALEXIN (KEFLEX) 500 MG CAPSULE    Take 1 capsule (500 mg total) by mouth 4 (four) times daily.   ONDANSETRON (ZOFRAN ODT) 4 MG DISINTEGRATING TABLET    Take 1 tablet (4 mg total) by mouth every 8 (eight) hours as needed for nausea.   OXYCODONE-ACETAMINOPHEN (PERCOCET/ROXICET) 5-325 MG PER TABLET    Take 2 tablets by mouth every 4 (four) hours as needed for severe pain.        Johnna Acosta, MD 10/07/2013 [phone]

## 2013-10-28 NOTE — ED Notes (Signed)
CT notified patient has finished contrast.

## 2013-10-31 NOTE — Consult Note (Signed)
I have reviewed and discussed the care of this patient in detail with the nurse practitioner including pertinent patient records, physical exam findings and data. I agree with details of this encounter.  

## 2013-11-01 DEATH — deceased

## 2015-08-31 IMAGING — CT CT ABD-PELV W/O CM
2 of 4 series · 16 of 46 positions shown, 18 images · non-contrast
Comparison: 01/28/2007 CT abdomen pelvis.

CLINICAL DATA: General body aches and. Emesis. Left-sided flank
pain.

EXAM:
CT ABDOMEN AND PELVIS WITHOUT CONTRAST
TECHNIQUE: Multidetector CT imaging of the abdomen and pelvis was performed
following the standard protocol without intravenous contrast.

[Series 2: stone study 5.0 i30f 1 · axial · 0.83mm/px · z∈[+939,+1354]mm · 13 of 91 slices shown, 15 images]
[im 4/91  soft-tissue]
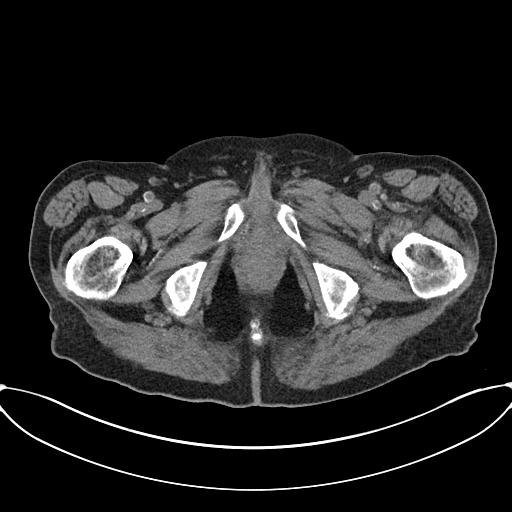
[im 4/91  bone]
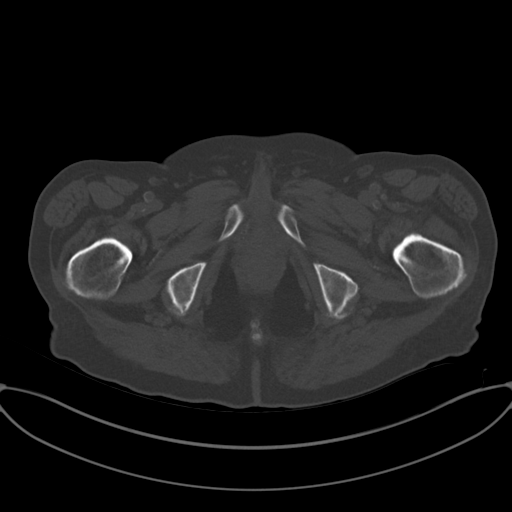
[im 12/91  soft-tissue]
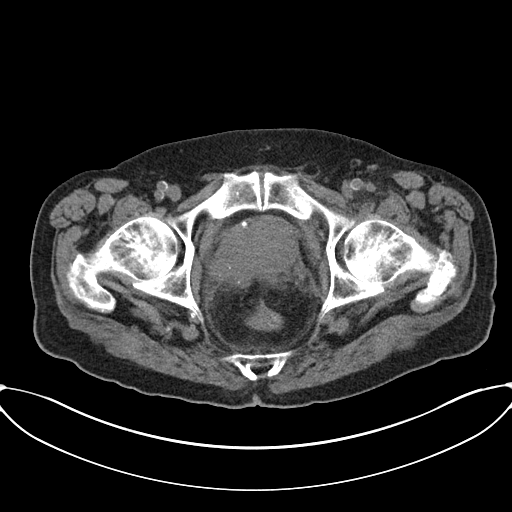
[im 19/91  soft-tissue]
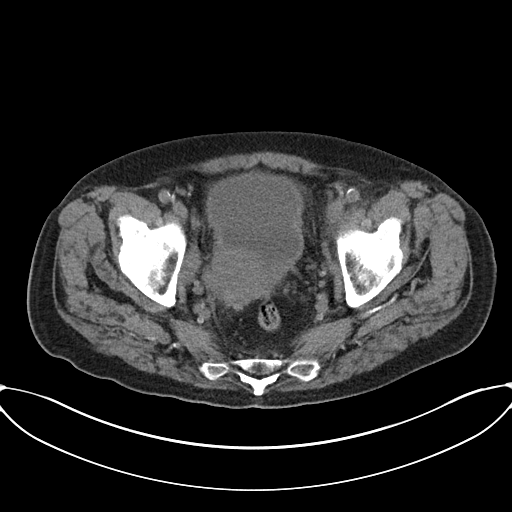
[im 27/91  soft-tissue]
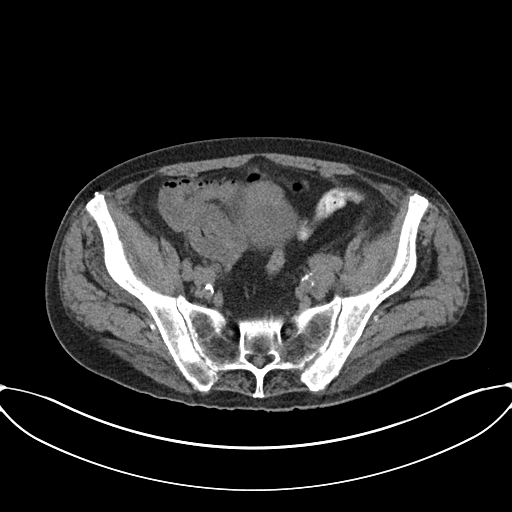
[im 31/91  soft-tissue]
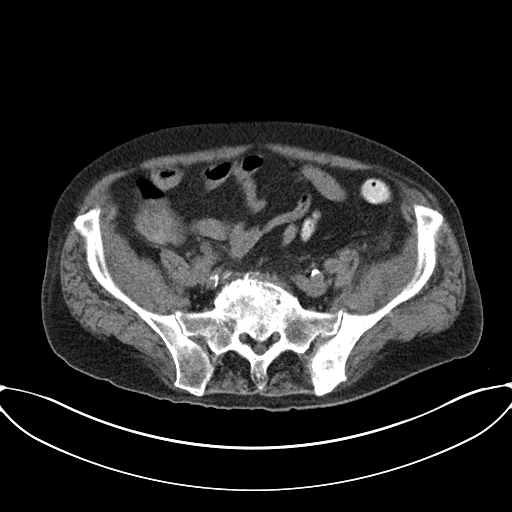
[im 38/91  soft-tissue]
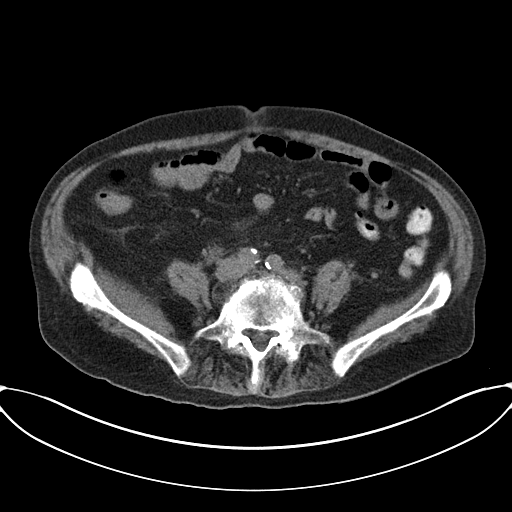
[im 46/91  soft-tissue]
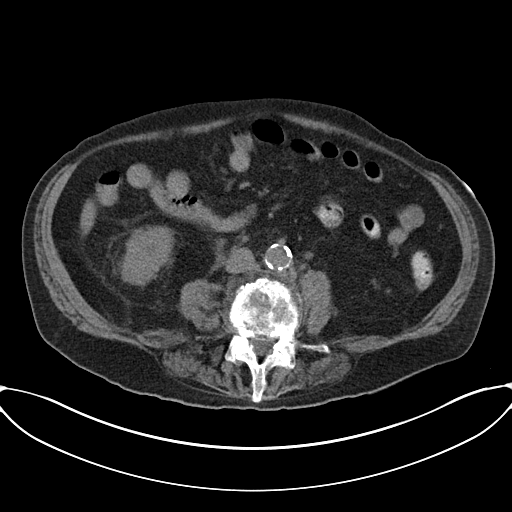
[im 53/91  soft-tissue]
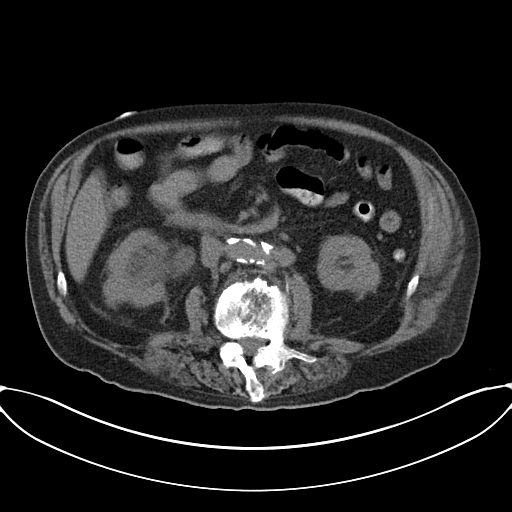
[im 61/91  soft-tissue]
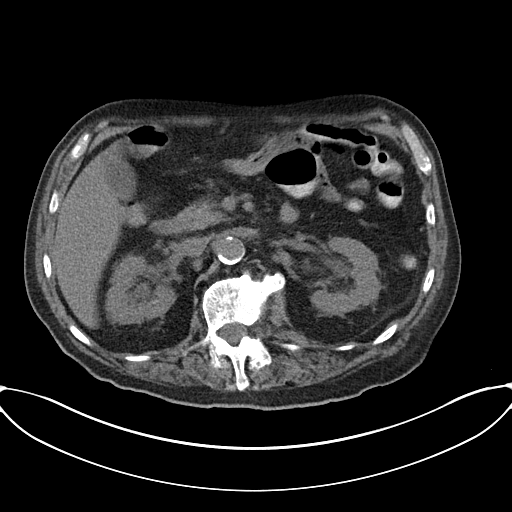
[im 61/91  bone]
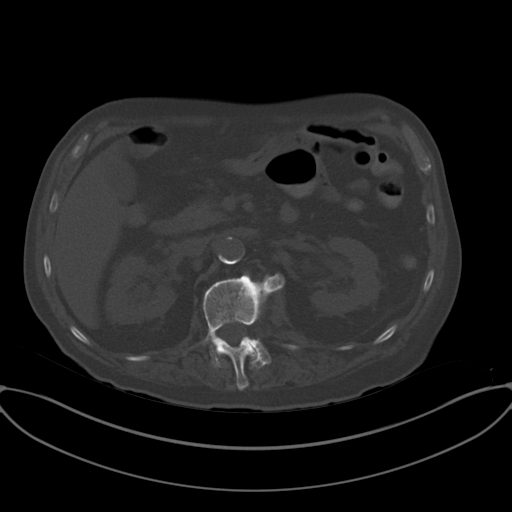
[im 64/91  soft-tissue]
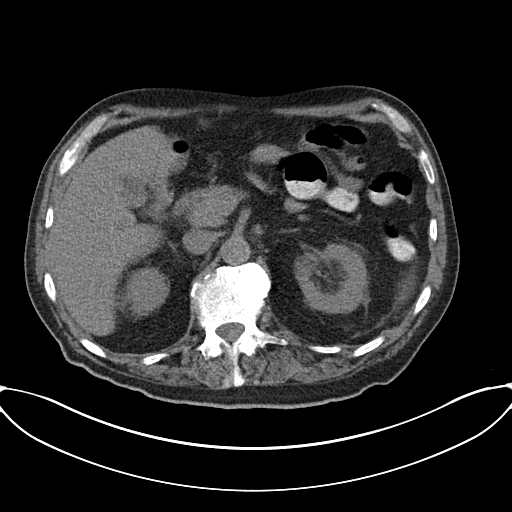
[im 72/91  soft-tissue]
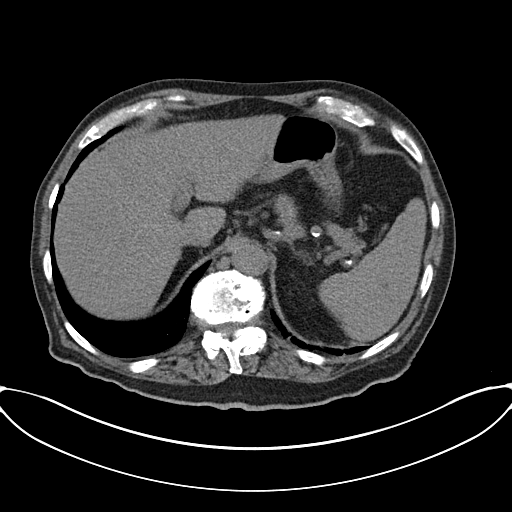
[im 79/91  soft-tissue]
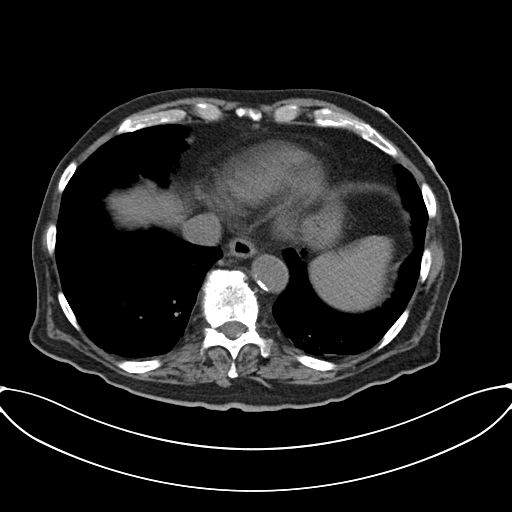
[im 87/91  soft-tissue]
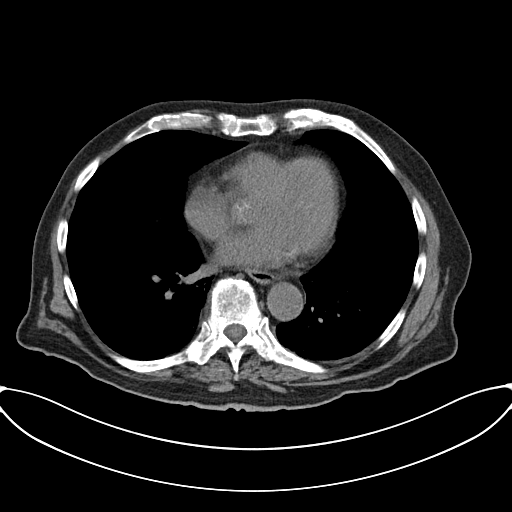

[Series 5: coronal soft tissue · coronal · 0.88mm/px · 3 of 100 slices shown]
[im 34/100  soft-tissue]
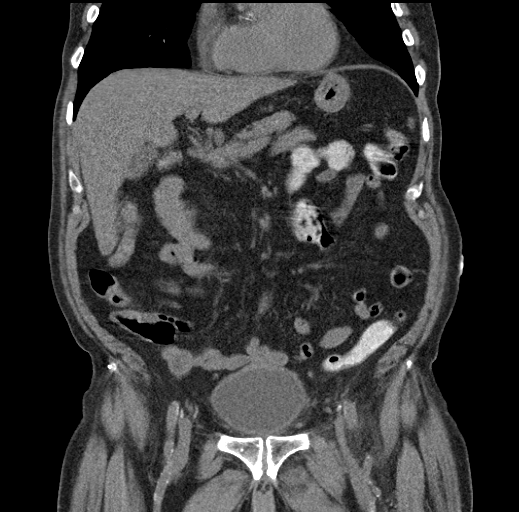
[im 45/100  soft-tissue]
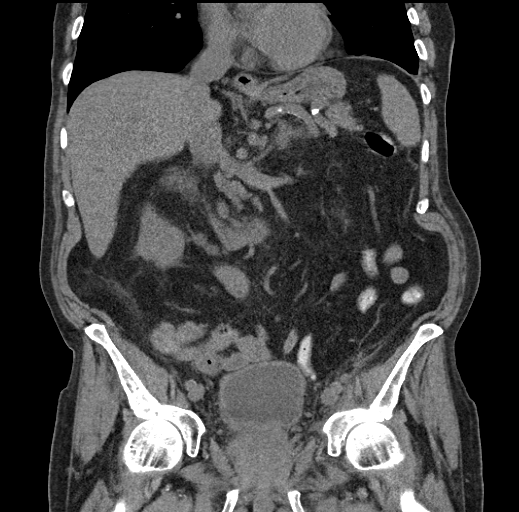
[im 56/100  soft-tissue]
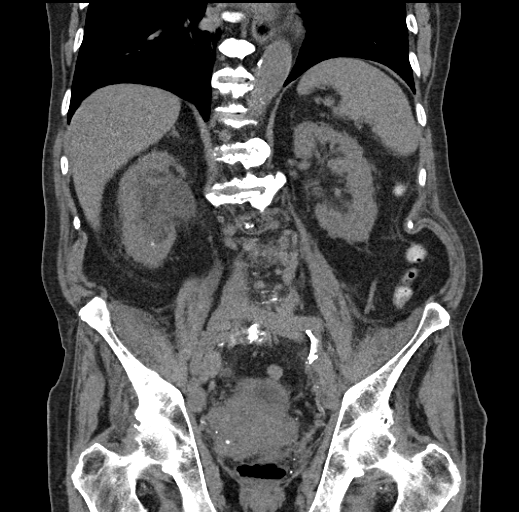

[16 of 46 positions shown; findings below may reference images not displayed]

FINDINGS: Basilar scarring/ subsegmental atelectasis.

Markedly enlarged prostate gland with asymmetric extension to the
right. This may be invading the right bladder wall at the level of
the right ureterovesical junction contributing to prominent
right-sided hydroureteronephrosis. This highly suspicious for
prostate carcinoma. Pelvic adenopathy greatest right operator region
measuring 3.7 x 2 cm. Right retrocrural slightly prominent size
lymph node. Para-aortic and interaortic caval adenopathy. Multiple
sclerotic foci involving the ileum bilaterally, sacrum, L3, L2 and
T12 suspicious for sclerotic metastatic disease.

Tiny nonobstructing renal calculi.

Taking into account limitation by non contrast imaging, no worrisome
of hepatic, splenic, pancreatic, renal or adrenal lesion. No
calcified gallstones.

Scattered colonic diverticula without extra luminal bowel
inflammatory process, free fluid or free air.

Ankylosis L2 through S1.  Degenerative changes above this region.

Atherosclerotic type changes aorta with marked ectasia without focal
aneurysm. Atherosclerotic type changes with ectasia common iliac
arteries. Atherosclerotic type changes aortic branch vessels with
ectatic tortuous splenic artery.
IMPRESSION: Findings highly suspicious for prostate cancer with with invasion of
the right bladder wall causing marked right-sided hydronephrosis.
Metastatic disease suspected as indicated by adenopathy and the
osseous sclerotic foci as noted above.

Atherosclerotic type changes aorta and common iliac arteries.

These results were called by telephone at the time of interpretation
on 09/15/2013 at [DATE] to Dr. ANJALI SHEIKH , who verbally
acknowledged these results.

## 2015-09-01 IMAGING — US IR PERC NEPHROSTOMY*R*
1 series · 3 of 3 positions shown · non-contrast
Comparison: none

CLINICAL DATA: Right hydronephrosis secondary to right distal
ureteral obstruction from invasive prostate cancer, elevated
creatinine

[Series 1: ir perc nephrostomy*right* · 3 of 3 slices shown]
[im 1/3]
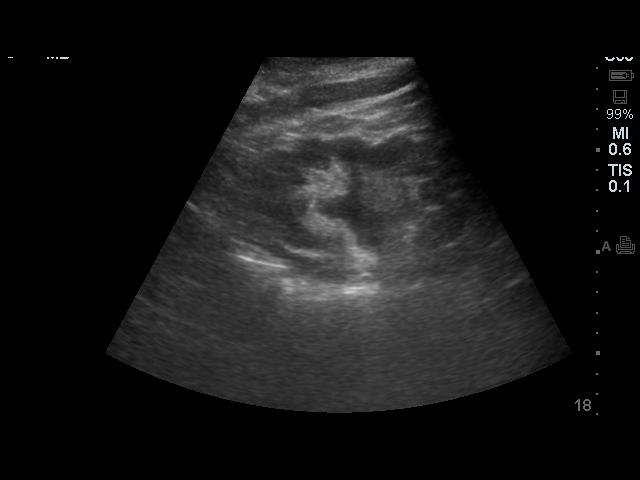
[im 2/3]
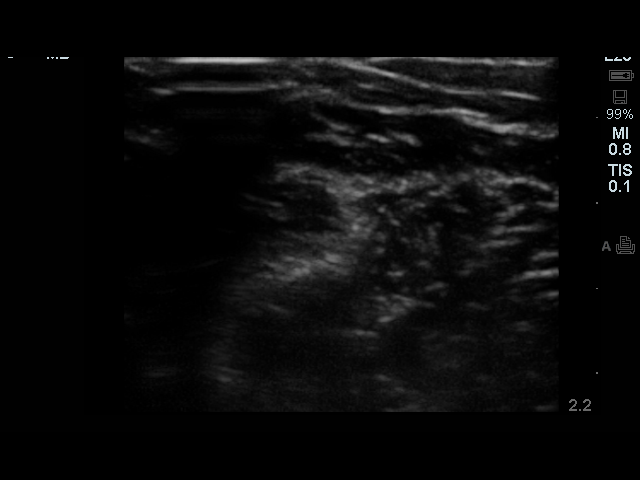
[im 3/3]
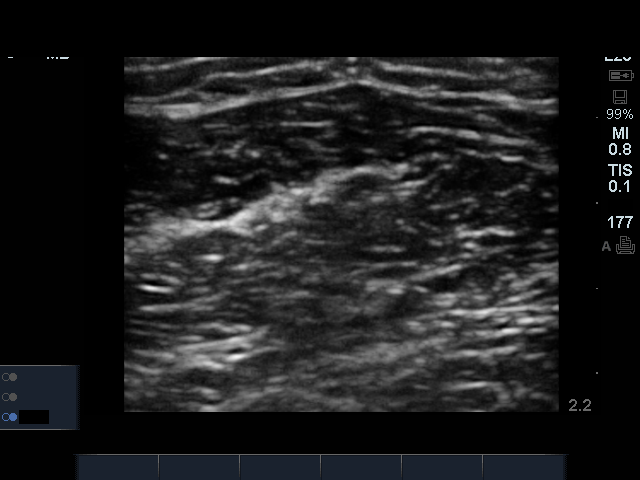

[3 of 3 positions shown; findings below may reference images not displayed]

EXAM:
ULTRASOUND GUIDANCE FOR NEPHROSTOMY ACCESS

FLUOROSCOPIC AT 10 FRENCH RIGHT NEPHROSTOMY INSERTION

Radiologist:  Billie, Che Wah

Guidance:  Ultrasound fluoroscopic

MEDICATIONS AND MEDICAL HISTORY:
400 mg a Cipro administered within 1 hr of the procedure, 1 mg
Versed, 25 mcg fentanyl

ANESTHESIA/SEDATION:
15 min

CONTRAST:  10mL OMNIPAQUE IOHEXOL 300 MG/ML  SOLN

FLUOROSCOPY TIME:  0.8 min

PROCEDURE:
Informed consent was obtained from the patient following explanation
of the procedure, risks, benefits and alternatives. The patient
understands, agrees and consents for the procedure. All questions
were addressed. A time out was performed.

Maximal barrier sterile technique utilized including caps, mask,
sterile gowns, sterile gloves, large sterile drape, hand hygiene,
and Betadine.

Previous imaging reviewed. Patient positioned prone. Preliminary
ultrasound performed demonstrating hydronephrosis of the right
kidney. Under sterile conditions and local anesthesia, ultrasound
needle access was performed of the right lower pole calyx. There was
return of urine. Guidewire inserted followed by the Accustick
dilator set. Guidewire exchange for an Amplatz guidewire. Tract
dilatation performed to advance a 10 French nephrostomy. Retention
loop formed in the renal pelvis. Contrast injection confirms
position. Catheter secured with a Prolene suture and connected to
external drainage. Sterile dressing applied. No immediate
complication. Patient tolerated the procedure well.

COMPLICATIONS:
No immediate
IMPRESSION: Successful ultrasound and fluoroscopic 10 French right nephrostomy
